# Patient Record
Sex: Female | Born: 1970 | Hispanic: No | Marital: Married | Smoking: Never smoker
Health system: Southern US, Community
[De-identification: ages and names within clinical notes are randomized; demographics above are authoritative.]

## PROBLEM LIST (undated history)

## (undated) DIAGNOSIS — K649 Unspecified hemorrhoids: Secondary | ICD-10-CM

## (undated) DIAGNOSIS — J45909 Unspecified asthma, uncomplicated: Secondary | ICD-10-CM

## (undated) DIAGNOSIS — D649 Anemia, unspecified: Secondary | ICD-10-CM

## (undated) HISTORY — DX: Unspecified hemorrhoids: K64.9

## (undated) HISTORY — DX: Anemia, unspecified: D64.9

---
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
## 2001-03-28 ENCOUNTER — Ambulatory Visit (HOSPITAL_COMMUNITY): Admission: RE | Admit: 2001-03-28 | Discharge: 2001-03-28 | Payer: Self-pay | Admitting: *Deleted

## 2001-03-28 ENCOUNTER — Encounter: Payer: Self-pay | Admitting: *Deleted

## 2001-08-21 ENCOUNTER — Encounter: Payer: Self-pay | Admitting: *Deleted

## 2001-08-21 ENCOUNTER — Ambulatory Visit (HOSPITAL_COMMUNITY): Admission: RE | Admit: 2001-08-21 | Discharge: 2001-08-21 | Payer: Self-pay | Admitting: *Deleted

## 2001-08-24 ENCOUNTER — Inpatient Hospital Stay (HOSPITAL_COMMUNITY): Admission: AD | Admit: 2001-08-24 | Discharge: 2001-08-27 | Payer: Self-pay | Admitting: Obstetrics

## 2007-10-02 ENCOUNTER — Inpatient Hospital Stay (HOSPITAL_COMMUNITY): Admission: AD | Admit: 2007-10-02 | Discharge: 2007-10-02 | Payer: Self-pay | Admitting: Obstetrics and Gynecology

## 2007-11-06 ENCOUNTER — Inpatient Hospital Stay (HOSPITAL_COMMUNITY): Admission: AD | Admit: 2007-11-06 | Discharge: 2007-11-09 | Payer: Self-pay | Admitting: Obstetrics

## 2008-11-16 ENCOUNTER — Emergency Department (HOSPITAL_COMMUNITY): Admission: EM | Admit: 2008-11-16 | Discharge: 2008-11-16 | Payer: Self-pay | Admitting: Emergency Medicine

## 2009-03-21 ENCOUNTER — Ambulatory Visit (HOSPITAL_COMMUNITY): Admission: RE | Admit: 2009-03-21 | Discharge: 2009-03-21 | Payer: Self-pay | Admitting: Obstetrics & Gynecology

## 2009-06-24 ENCOUNTER — Inpatient Hospital Stay (HOSPITAL_COMMUNITY): Admission: RE | Admit: 2009-06-24 | Discharge: 2009-06-24 | Payer: Self-pay | Admitting: Obstetrics & Gynecology

## 2009-08-26 ENCOUNTER — Inpatient Hospital Stay (HOSPITAL_COMMUNITY): Admission: RE | Admit: 2009-08-26 | Discharge: 2009-08-29 | Payer: Self-pay | Admitting: Obstetrics & Gynecology

## 2010-05-10 LAB — RH IMMUNE GLOB WKUP(>/=20WKS)(NOT WOMEN'S HOSP)

## 2010-05-10 LAB — CBC
HCT: 33.3 % — ABNORMAL LOW (ref 36.0–46.0)
Hemoglobin: 10.8 g/dL — ABNORMAL LOW (ref 12.0–15.0)
MCH: 20.3 pg — ABNORMAL LOW (ref 26.0–34.0)
MCH: 20.5 pg — ABNORMAL LOW (ref 26.0–34.0)
MCHC: 32 g/dL (ref 30.0–36.0)
MCHC: 32.3 g/dL (ref 30.0–36.0)
MCV: 63.3 fL — ABNORMAL LOW (ref 78.0–100.0)
Platelets: 225 10*3/uL (ref 150–400)
Platelets: 236 10*3/uL (ref 150–400)
RBC: 5.26 MIL/uL — ABNORMAL HIGH (ref 3.87–5.11)
RBC: 5.29 MIL/uL — ABNORMAL HIGH (ref 3.87–5.11)
RDW: 15.6 % — ABNORMAL HIGH (ref 11.5–15.5)
RDW: 15.9 % — ABNORMAL HIGH (ref 11.5–15.5)
WBC: 4.4 10*3/uL (ref 4.0–10.5)
WBC: 6.8 10*3/uL (ref 4.0–10.5)

## 2010-05-10 LAB — RPR: RPR Ser Ql: NONREACTIVE

## 2010-05-12 LAB — RH IMMUNE GLOBULIN WORKUP (NOT WOMEN'S HOSP): Antibody Screen: NEGATIVE

## 2010-08-14 ENCOUNTER — Encounter (INDEPENDENT_AMBULATORY_CARE_PROVIDER_SITE_OTHER): Payer: Self-pay | Admitting: General Surgery

## 2010-08-14 DIAGNOSIS — K649 Unspecified hemorrhoids: Secondary | ICD-10-CM | POA: Insufficient documentation

## 2010-08-20 ENCOUNTER — Ambulatory Visit (INDEPENDENT_AMBULATORY_CARE_PROVIDER_SITE_OTHER): Payer: Self-pay | Admitting: General Surgery

## 2010-09-03 ENCOUNTER — Encounter (INDEPENDENT_AMBULATORY_CARE_PROVIDER_SITE_OTHER): Payer: Self-pay | Admitting: General Surgery

## 2010-09-10 ENCOUNTER — Ambulatory Visit (INDEPENDENT_AMBULATORY_CARE_PROVIDER_SITE_OTHER): Payer: 59 | Admitting: General Surgery

## 2010-09-10 ENCOUNTER — Encounter (INDEPENDENT_AMBULATORY_CARE_PROVIDER_SITE_OTHER): Payer: Self-pay | Admitting: General Surgery

## 2010-09-10 VITALS — BP 122/80 | HR 84 | Temp 97.3°F | Ht 65.0 in | Wt 193.2 lb

## 2010-09-10 DIAGNOSIS — K648 Other hemorrhoids: Secondary | ICD-10-CM

## 2010-09-10 NOTE — Patient Instructions (Signed)
I believe that the source of your rectal bleeding is internal hemorrhoids. I injected a medicine into them today that should make the bleeding stopped. If the bleeding does not stop in one month, then you need to see a gastroenterologist for a total colonoscopy. Keep her bowel movements soft with stool softeners. Drink lots of water and fluid. Return to see me only if there are worsening problems.

## 2010-09-10 NOTE — Progress Notes (Signed)
Subjective:     Patient ID: Holly Newton, female   DOB: [birth year], 40 y.o.   MRN: [id number]  HPI This is a 40 year old African woman from the [place]. She has had 5 pregnancies and 5 deliveries, her last delivery was in July 2011. She has had some rectal bleeding a little bit of discomfort since June of 2012. She has never had any rectal surgery reading rectal procedures done. She has not had any prior rectal problems. She denies any external swelling.  She was referred to me by sterile Dani Gobble, P.A. at [facility] on [address].  Past Medical History  Diagnosis Date  . Bleeding hemorrhoid   . Anemia     Current outpatient prescriptions:hydrocortisone (ANUSOL-HC) 25 MG suppository, Place 25 mg rectally 2 (two) times daily.  , Disp: , Rfl:   No Known Allergies  History reviewed. No pertinent family history.  History  Substance Use Topics  . Smoking status: Never Smoker   . Smokeless tobacco: Not on file  . Alcohol Use: No      Review of Systems  Constitutional: Negative.   HENT: Negative.   Eyes: Negative.   Respiratory: Negative.   Cardiovascular: Negative.   Gastrointestinal: Positive for constipation, blood in stool, anal bleeding and rectal pain. Negative for nausea, vomiting, abdominal pain, diarrhea and abdominal distention.  Genitourinary: Negative.   Skin: Negative.   Neurological: Negative.   Hematological: Negative.   Psychiatric/Behavioral: Negative.        Objective:   Physical Exam  Constitutional: She appears well-nourished. No distress.  HENT:  Head: Atraumatic.  Mouth/Throat: No oropharyngeal exudate.  Eyes: Conjunctivae and EOM are normal. No scleral icterus.  Neck: Normal range of motion. Neck supple. No thyromegaly present.  Cardiovascular: Normal rate, regular rhythm and normal heart sounds.  Exam reveals no gallop.   No murmur heard. Pulmonary/Chest: Effort normal and breath sounds normal. No respiratory  distress. She has no wheezes. She has no rales. She exhibits no tenderness.  Abdominal: Soft. Bowel sounds are normal. She exhibits no distension and no mass. There is no tenderness. There is no rebound and no guarding.  Genitourinary:     Musculoskeletal: Normal range of motion. She exhibits no edema and no tenderness.  Lymphadenopathy:    She has no cervical adenopathy.  Neurological: She displays normal reflexes. No cranial nerve deficit. She exhibits normal muscle tone. Coordination normal.  Skin: Skin is dry. No rash noted. No erythema. No pallor.  Psychiatric: She has a normal mood and affect. Judgment and thought content normal.       Assessment:     Intermittent rectal bleeding secondary to internal hemorrhoids.  Multiparity.    Plan:     Internal hemorrhoids were injected left lateral and right anterior today.  She is advised in a high-fiber, low-fat diet with a daily stool softeners and hydration.  She was advised that if she has any further bleeding after 2 weeks, that the source of the bleeding may be due to another cause and that she would need to be referred to a gastroenterologist for a colonoscopy.  Return to see me p.r.n.

## 2010-11-23 LAB — CBC
HCT: 30.3 — ABNORMAL LOW
Platelets: 260
RDW: 15.5

## 2010-11-25 LAB — CBC
Hemoglobin: 10.4 — ABNORMAL LOW
RDW: 15.5
WBC: 5.3

## 2012-03-18 ENCOUNTER — Emergency Department (HOSPITAL_COMMUNITY): Payer: Self-pay

## 2012-03-18 ENCOUNTER — Encounter (HOSPITAL_COMMUNITY): Payer: Self-pay | Admitting: Emergency Medicine

## 2012-03-18 ENCOUNTER — Other Ambulatory Visit: Payer: Self-pay

## 2012-03-18 ENCOUNTER — Emergency Department (HOSPITAL_COMMUNITY)
Admission: EM | Admit: 2012-03-18 | Discharge: 2012-03-18 | Disposition: A | Discharge status: Home / Self Care | Payer: Self-pay | Attending: Emergency Medicine | Admitting: Emergency Medicine

## 2012-03-18 DIAGNOSIS — R42 Dizziness and giddiness: Secondary | ICD-10-CM | POA: Insufficient documentation

## 2012-03-18 DIAGNOSIS — R51 Headache: Secondary | ICD-10-CM | POA: Insufficient documentation

## 2012-03-18 DIAGNOSIS — T7492XA Unspecified child maltreatment, confirmed, initial encounter: Secondary | ICD-10-CM | POA: Insufficient documentation

## 2012-03-18 DIAGNOSIS — T7491XA Unspecified adult maltreatment, confirmed, initial encounter: Secondary | ICD-10-CM | POA: Insufficient documentation

## 2012-03-18 DIAGNOSIS — Z862 Personal history of diseases of the blood and blood-forming organs and certain disorders involving the immune mechanism: Secondary | ICD-10-CM | POA: Insufficient documentation

## 2012-03-18 DIAGNOSIS — S0990XA Unspecified injury of head, initial encounter: Secondary | ICD-10-CM | POA: Insufficient documentation

## 2012-03-18 DIAGNOSIS — Z8719 Personal history of other diseases of the digestive system: Secondary | ICD-10-CM | POA: Insufficient documentation

## 2012-03-18 DIAGNOSIS — IMO0002 Reserved for concepts with insufficient information to code with codable children: Secondary | ICD-10-CM | POA: Insufficient documentation

## 2012-03-18 LAB — CBC
HCT: 33 % — ABNORMAL LOW (ref 36.0–46.0)
Hemoglobin: 10.6 g/dL — ABNORMAL LOW (ref 12.0–15.0)
MCH: 19.3 pg — ABNORMAL LOW (ref 26.0–34.0)
MCHC: 32.1 g/dL (ref 30.0–36.0)
MCV: 60 fL — ABNORMAL LOW (ref 78.0–100.0)
Platelets: 280 10*3/uL (ref 150–400)
RBC: 5.5 MIL/uL — ABNORMAL HIGH (ref 3.87–5.11)
RDW: 15.3 % (ref 11.5–15.5)
WBC: 3.9 10*3/uL — ABNORMAL LOW (ref 4.0–10.5)

## 2012-03-18 LAB — BASIC METABOLIC PANEL
BUN: 10 mg/dL (ref 6–23)
CO2: 25 mEq/L (ref 19–32)
Calcium: 8.8 mg/dL (ref 8.4–10.5)
Chloride: 102 mEq/L (ref 96–112)
Creatinine, Ser: 0.49 mg/dL — ABNORMAL LOW (ref 0.50–1.10)
GFR calc Af Amer: >90 mL/min (ref >90)
GFR calc non Af Amer: >90 mL/min (ref >90)
Glucose, Bld: 77 mg/dL (ref 70–99)
Potassium: 4 mEq/L (ref 3.5–5.1)
Sodium: 135 mEq/L (ref 135–145)

## 2012-03-18 LAB — POCT PREGNANCY, URINE: Preg Test, Ur: NEGATIVE

## 2012-03-18 MED ORDER — SODIUM CHLORIDE 0.9 % IV BOLUS (SEPSIS)
1000.0000 mL | Freq: Once | INTRAVENOUS | Status: AC
  Administered 2012-03-18: 1000 mL via INTRAVENOUS
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
## 2012-03-18 NOTE — ED Notes (Signed)
Pt logged rolled by 3 members staff maintaining c-spine precautions.  BB removed c-collar remains in place.  Pt had a 3-46mm nodule right posterior scalp.  No bleed or discharge.  Painful to palpation. Only complaint at this time
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
## 2012-03-18 NOTE — ED Notes (Signed)
Patient transported to CT 
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
## 2012-03-18 NOTE — ED Notes (Signed)
Pt is walking around in the room and walked out to the nurses desk

## 2012-03-19 NOTE — ED Provider Notes (Signed)
Medical screening examination/treatment/procedure(s) were conducted as a shared visit with non-physician practitioner(s) and myself.  I personally evaluated the patient during the encounter.  Please see completed note.  Alexa Golebiewski, MD 03/19/12 0913 

## 2012-10-20 ENCOUNTER — Encounter (HOSPITAL_COMMUNITY): Payer: Self-pay | Admitting: *Deleted

## 2012-10-20 ENCOUNTER — Emergency Department (HOSPITAL_COMMUNITY)
Admission: EM | Admit: 2012-10-20 | Discharge: 2012-10-21 | Disposition: A | Discharge status: Home / Self Care | Payer: BC Managed Care – PPO | Attending: Emergency Medicine | Admitting: Emergency Medicine

## 2012-10-20 DIAGNOSIS — Y929 Unspecified place or not applicable: Secondary | ICD-10-CM | POA: Insufficient documentation

## 2012-10-20 DIAGNOSIS — Y9301 Activity, walking, marching and hiking: Secondary | ICD-10-CM | POA: Insufficient documentation

## 2012-10-20 DIAGNOSIS — S91209A Unspecified open wound of unspecified toe(s) with damage to nail, initial encounter: Secondary | ICD-10-CM

## 2012-10-20 DIAGNOSIS — W010XXA Fall on same level from slipping, tripping and stumbling without subsequent striking against object, initial encounter: Secondary | ICD-10-CM | POA: Insufficient documentation

## 2012-10-20 DIAGNOSIS — IMO0002 Reserved for concepts with insufficient information to code with codable children: Secondary | ICD-10-CM | POA: Insufficient documentation

## 2012-10-20 DIAGNOSIS — S91109A Unspecified open wound of unspecified toe(s) without damage to nail, initial encounter: Secondary | ICD-10-CM | POA: Insufficient documentation

## 2012-10-20 MED ORDER — BUPIVACAINE HCL (PF) 0.5 % IJ SOLN
50.0000 mL | Freq: Once | INTRAMUSCULAR | Status: AC
  Administered 2012-10-21: 50 mL

## 2012-10-20 MED ORDER — BUPIVACAINE HCL (PF) 0.5 % IJ SOLN
INTRAMUSCULAR | Status: DC
Start: 2012-10-20 — End: 2012-10-21
  Filled 2012-10-20: qty 30

## 2012-10-20 NOTE — ED Notes (Signed)
Per pt report: pt tripped on dress and fell and hit her left big toe.  Toe nail is off the toe and pt is able to move big toe.  No obvious deformity noted.  Pt denies LOC or any injuries anywhere else.

## 2012-10-20 NOTE — ED Provider Notes (Signed)
CSN: [id number]     Arrival date & time 10/20/12  2309 History   First MD Initiated Contact with Patient 10/20/12 2316     Chief Complaint  Patient presents with  . Toe Pain   (Consider location/radiation/quality/duration/timing/severity/associated sxs/prior Treatment) HPI Comments: Patient was walking in [place] when she hit her left great toe causing the nail to be partially avulsed.   Patient is a 42 y.o. female presenting with toe pain. The history is provided by the patient.  Toe Pain This is a new problem. The problem occurs constantly. Pertinent negatives include no chills, fever or joint swelling. The symptoms are aggravated by walking. She has tried nothing for the symptoms.    Past Medical History  Diagnosis Date  . Bleeding hemorrhoid   . Anemia    History reviewed. No pertinent past surgical history. No family history on file. History  Substance Use Topics  . Smoking status: Never Smoker   . Smokeless tobacco: Not on file  . Alcohol Use: No   OB History   Grav Para Term Preterm Abortions TAB SAB Ect Mult Living                 Review of Systems  Constitutional: Negative for fever and chills.  Musculoskeletal: Negative for joint swelling.  Skin: Positive for wound.  All other systems reviewed and are negative.    Allergies  Review of patient's allergies indicates no known allergies.  Home Medications  No current outpatient prescriptions on file. BP 121/70  Pulse 93  Temp(Src) 97.6 F (36.4 C) (Oral)  Resp 18  Ht 5\' 5"  (1.651 m)  Wt 183 lb (83.008 kg)  BMI 30.45 kg/m2  SpO2 100%  LMP 09/24/2012 Physical Exam  Nursing note and vitals reviewed. Constitutional: She appears well-developed and well-nourished.  HENT:  Head: Normocephalic.  Eyes: Pupils are equal, round, and reactive to light.  Neck: Normal range of motion.  Cardiovascular: Normal rate.   Pulmonary/Chest: Effort normal.  Musculoskeletal: She exhibits tenderness. She exhibits no  edema.       Feet:  Neurological: She is alert.  Skin: Skin is warm.    ED Course  NAIL REMOVAL Date/Time: 10/21/2012 12:32 AM Performed by: Arman Filter Authorized by: Arman Filter Consent: Verbal consent obtained. Risks and benefits: risks, benefits and alternatives were discussed Consent given by: patient Patient identity confirmed: verbally with patient Anesthesia: digital block Patient sedated: no Preparation: skin prepped with [employer] Amount removed: complete Nail bed sutured: no Nail matrix removed: complete Removed nail replaced and anchored: no Dressing: Xeroform gauze Patient tolerance: Patient tolerated the procedure well with no immediate complications. Comments: Nail removed no bleeding or laceration of nail bed    (including critical care time) Labs Review Labs Reviewed - No data to display Imaging Review No results found.  MDM   1. Nail avulsion of toe, initial encounter        Arman Filter, NP 10/21/12 [phone]

## 2012-10-21 NOTE — ED Provider Notes (Signed)
Medical screening examination/treatment/procedure(s) were performed by non-physician practitioner and as supervising physician I was immediately available for consultation/collaboration.  Avishai Reihl M Girl Schissler, MD 10/21/12 0618 

## 2013-06-28 ENCOUNTER — Encounter: Payer: Self-pay | Admitting: Obstetrics & Gynecology

## 2013-06-28 ENCOUNTER — Ambulatory Visit: Payer: BC Managed Care – PPO | Admitting: Obstetrics & Gynecology

## 2013-06-28 ENCOUNTER — Ambulatory Visit (INDEPENDENT_AMBULATORY_CARE_PROVIDER_SITE_OTHER): Payer: BC Managed Care – PPO | Admitting: Obstetrics & Gynecology

## 2013-06-28 VITALS — BP 112/76 | HR 67 | Temp 98.0°F | Ht 65.0 in | Wt 183.0 lb

## 2013-06-28 DIAGNOSIS — Z30431 Encounter for routine checking of intrauterine contraceptive device: Secondary | ICD-10-CM

## 2013-06-28 NOTE — Progress Notes (Signed)
Patient ID: Holly Newton, female   DOB: 03/10/1970, 43 y.o.   MRN: [id number]  Chief Complaint  Patient presents with  . IUD Check    HPI Holly Newton is a 43 y.o. female.  The IUD was placed several years ago.  The has never had the IUD checked after placement  HPI  Past Medical History  Diagnosis Date  . Bleeding hemorrhoid   . Anemia     History reviewed. No pertinent past surgical history.  History reviewed. No pertinent family history.  Social History History  Substance Use Topics  . Smoking status: Never Smoker   . Smokeless tobacco: Not on file  . Alcohol Use: No    No Known Allergies  No current outpatient prescriptions on file.   No current facility-administered medications for this visit.    Review of Systems Review of Systems Constitutional: negative for fatigue and weight loss Respiratory: negative for cough and wheezing Cardiovascular: negative for chest pain, fatigue and palpitations Gastrointestinal: negative for abdominal pain and change in bowel habits Genitourinary:positive for introitus "too big" Integument/breast: negative for nipple discharge Musculoskeletal:negative for myalgias Neurological: negative for gait problems and tremors Behavioral/Psych: negative for abusive relationship, depression Endocrine: negative for temperature intolerance     Blood pressure 112/76, pulse 67, temperature 98 F (36.7 C), height 5\' 5"  (1.651 m), weight 83.008 kg (183 lb), last menstrual period 06/25/2013.  Physical Exam Physical Exam General:   alert  Skin:   no rash or abnormalities  Lungs:   clear to auscultation bilaterally  Heart:   regular rate and rhythm, S1, S2 normal, no murmur, click, rub or gallop  Breasts:   normal without suspicious masses, skin or nipple changes or axillary nodes  Abdomen:  normal findings: no organomegaly, soft, non-tender and no hernia  Pelvis:  External genitalia: normal general appearance Urinary system: urethral  meatus normal and bladder without fullness, nontender Vaginal: normal without tenderness, induration or masses Cervix: normal appearance; IUD strings seen Adnexa: normal bimanual exam Uterus: anteverted and non-tender, normal size      Data Reviewed None  Assessment    IUD in place Pt has concerns re: possible gaping introitus    Plan    Possible candidate for a plastic procedure; recommend postponing any procedure until childbearing complete; risk of scarring, pain reviewed Follow up as needed.         Antionette CharLisa Jackson-Moore 07/01/2013, 1:25 PM

## 2013-07-26 ENCOUNTER — Telehealth: Payer: Self-pay | Admitting: *Deleted

## 2013-07-26 NOTE — Telephone Encounter (Signed)
Needs preop visit

## 2013-07-26 NOTE — Telephone Encounter (Signed)
Please make pre op appointment for patient with Dr Tamela Oddi, first available, non urgent.

## 2013-07-26 NOTE — Telephone Encounter (Signed)
Pt called in to office in regards to wanting surgery scheduled.  It was discussed at her last visit that she may have vaginal sugery / possible female circ revision. Pt would like to know when she would be able to have procedure done.    Please advise.

## 2013-07-31 NOTE — Telephone Encounter (Signed)
[id number] - [facility] - LEFT PATIENT VM TO CALL AND SCHEDULE APPT REQUESTED.

## 2013-08-04 ENCOUNTER — Encounter (HOSPITAL_COMMUNITY): Payer: Self-pay | Admitting: Emergency Medicine

## 2013-08-04 ENCOUNTER — Emergency Department (HOSPITAL_COMMUNITY)
Admission: EM | Admit: 2013-08-04 | Discharge: 2013-08-04 | Disposition: A | Discharge status: Home / Self Care | Payer: 59 | Attending: Emergency Medicine | Admitting: Emergency Medicine

## 2013-08-04 DIAGNOSIS — Z8679 Personal history of other diseases of the circulatory system: Secondary | ICD-10-CM | POA: Insufficient documentation

## 2013-08-04 DIAGNOSIS — H9209 Otalgia, unspecified ear: Secondary | ICD-10-CM | POA: Diagnosis not present

## 2013-08-04 DIAGNOSIS — J3489 Other specified disorders of nose and nasal sinuses: Secondary | ICD-10-CM | POA: Insufficient documentation

## 2013-08-04 DIAGNOSIS — H938X9 Other specified disorders of ear, unspecified ear: Secondary | ICD-10-CM | POA: Insufficient documentation

## 2013-08-04 DIAGNOSIS — H938X1 Other specified disorders of right ear: Secondary | ICD-10-CM

## 2013-08-04 DIAGNOSIS — Z862 Personal history of diseases of the blood and blood-forming organs and certain disorders involving the immune mechanism: Secondary | ICD-10-CM | POA: Insufficient documentation

## 2013-08-04 MED ORDER — ALBUTEROL SULFATE HFA 108 (90 BASE) MCG/ACT IN AERS
2.0000 | INHALATION_SPRAY | Freq: Once | RESPIRATORY_TRACT | Status: AC
  Administered 2013-08-04: 2 via RESPIRATORY_TRACT
  Filled 2013-08-04: qty 6.7

## 2013-08-04 MED ORDER — PSEUDOEPHEDRINE HCL 30 MG/5ML PO SYRP: 30.0000 mg | ORAL_SOLUTION | Freq: Four times a day (QID) | ORAL | Status: DC | PRN

## 2013-08-04 NOTE — ED Provider Notes (Signed)
CSN: [id number]     Arrival date & time 08/04/13  [id number] History   First MD Initiated Contact with Patient 08/04/13 1006     Chief Complaint  Patient presents with  . Ear Fullness     (Consider location/radiation/quality/duration/timing/severity/associated sxs/prior Treatment) Patient is a 43 y.o. female presenting with plugged ear sensation. The history is provided by the patient. No language interpreter was used.  Ear Fullness This is a new problem. The current episode started yesterday. The problem occurs constantly. The problem has been unchanged. Associated symptoms include congestion. Pertinent negatives include no abdominal pain, arthralgias, chest pain, chills, fatigue, fever, nausea, neck pain, vomiting or weakness. Nothing aggravates the symptoms. She has tried nothing for the symptoms. The treatment provided no relief.    Past Medical History  Diagnosis Date  . Bleeding hemorrhoid   . Anemia    History reviewed. No pertinent past surgical history. No family history on file. History  Substance Use Topics  . Smoking status: Never Smoker   . Smokeless tobacco: Not on file  . Alcohol Use: No   OB History   Grav Para Term Preterm Abortions TAB SAB Ect Mult Living   5 5 5  0 0 0 0 0 0 5     Review of Systems  Constitutional: Negative for fever, chills and fatigue.  HENT: Positive for congestion and ear pain. Negative for trouble swallowing.   Eyes: Negative for visual disturbance.  Respiratory: Negative for shortness of breath.   Cardiovascular: Negative for chest pain and palpitations.  Gastrointestinal: Negative for nausea, vomiting, abdominal pain and diarrhea.  Genitourinary: Negative for dysuria and difficulty urinating.  Musculoskeletal: Negative for arthralgias and neck pain.  Skin: Negative for color change.  Neurological: Negative for dizziness and weakness.  Psychiatric/Behavioral: Negative for dysphoric mood.      Allergies  Review of patient's allergies  indicates no known allergies.  Home Medications   Prior to Admission medications   Not on File   BP 117/57  Pulse 77  Temp(Src) 98.4 F (36.9 C) (Oral)  Resp 18  SpO2 100% Physical Exam  Nursing note and vitals reviewed. Constitutional: She is oriented to person, place, and time. She appears well-developed and well-nourished. No distress.  HENT:  Head: Normocephalic and atraumatic.  Right Ear: External ear normal.  Left Ear: External ear normal.  Mouth/Throat: Oropharynx is clear and moist. No oropharyngeal exudate.  Right TM mildly erythematous and bulging and intact. No tenderness with retraction of the right auricle. No mastoid tenderness.   Left TM intact and unremarkable. No tenderness with retraction of auricle. No mastoid tenderness.   Eyes: Conjunctivae and EOM are normal. Pupils are equal, round, and reactive to light.  Neck: Normal range of motion.  Cardiovascular: Normal rate and regular rhythm.  Exam reveals no gallop and no friction rub.   No murmur heard. Pulmonary/Chest: Effort normal and breath sounds normal. She has no wheezes. She has no rales. She exhibits no tenderness.  Musculoskeletal: Normal range of motion.  Lymphadenopathy:    She has no cervical adenopathy.  Neurological: She is alert and oriented to person, place, and time. Coordination normal.  Speech is goal-oriented. Moves limbs without ataxia.   Skin: Skin is warm and dry.  Psychiatric: She has a normal mood and affect. Her behavior is normal.    ED Course  Procedures (including critical care time) Labs Review Labs Reviewed - No data to display  Imaging Review No results found.   EKG Interpretation None  MDM   Final diagnoses:  Congestion of right ear   10:20 AM Patient likely has congestion causing her right ear fullness and pressure sensation. Vitals stable and patient afebrile. Patient will likely benefit from a decongestant medication. Patient also requesting an albuterol  inhaler.    Holly BeckKaitlyn Aimy Sweeting, PA-C 08/04/13 1025

## 2013-08-04 NOTE — ED Notes (Signed)
Pt c/o "water in my right ear" that started yesterday. Pt states that it is painful.

## 2013-08-04 NOTE — Discharge Instructions (Signed)
Take Sudafed as needed for ear congestion.

## 2013-08-05 NOTE — ED Provider Notes (Signed)
Medical screening examination/treatment/procedure(s) were performed by non-physician practitioner and as supervising physician I was immediately available for consultation/collaboration.   EKG Interpretation None       Kimla Furth, MD 08/05/13 1009 

## 2013-08-06 ENCOUNTER — Telehealth: Payer: Self-pay | Admitting: *Deleted

## 2013-08-06 NOTE — Telephone Encounter (Signed)
[id number] - 8: 52 am  - called patient and scheduled appt

## 2013-08-06 NOTE — Telephone Encounter (Signed)
Holly Newton is calling wanting to schedule her surgery for revision before she leaves the country. Holly Newton is wanting to have her surgery in July. Holly Newton informed that I will have to check [facility] availability and she may not be able to do that before she has to travel. Will p[ut her on the cancellation list.

## 2013-08-09 ENCOUNTER — Telehealth: Payer: Self-pay | Admitting: *Deleted

## 2013-08-09 NOTE — Telephone Encounter (Signed)
A return call was placed to pt to discuss her need for appt for a pre op visit.  Pt states that she would like to have surgery in July.  Made pt aware that she would first need a pre op visit in office to discuss with Dr Tamela OddiJackson Moore.  Pt states that if surgery can't be done in July that she will not be traveling out of town.  Made pt aware that we could try to move up her pre op appt.  Pt now scheduled for pre op on 09/05/13.

## 2013-09-05 ENCOUNTER — Ambulatory Visit: Payer: Self-pay | Admitting: Obstetrics & Gynecology

## 2013-10-29 ENCOUNTER — Encounter (HOSPITAL_COMMUNITY): Payer: Self-pay | Admitting: Emergency Medicine

## 2013-10-29 ENCOUNTER — Emergency Department (HOSPITAL_COMMUNITY)
Admission: EM | Admit: 2013-10-29 | Discharge: 2013-10-30 | Disposition: A | Discharge status: Home / Self Care | Payer: 59 | Attending: Emergency Medicine | Admitting: Emergency Medicine

## 2013-10-29 DIAGNOSIS — Z862 Personal history of diseases of the blood and blood-forming organs and certain disorders involving the immune mechanism: Secondary | ICD-10-CM | POA: Insufficient documentation

## 2013-10-29 DIAGNOSIS — F410 Panic disorder [episodic paroxysmal anxiety] without agoraphobia: Secondary | ICD-10-CM | POA: Diagnosis present

## 2013-10-29 DIAGNOSIS — R45851 Suicidal ideations: Secondary | ICD-10-CM | POA: Diagnosis not present

## 2013-10-29 DIAGNOSIS — Z8679 Personal history of other diseases of the circulatory system: Secondary | ICD-10-CM | POA: Diagnosis not present

## 2013-10-29 DIAGNOSIS — F4322 Adjustment disorder with anxiety: Secondary | ICD-10-CM

## 2013-10-29 NOTE — ED Notes (Addendum)
Bed: WU98 Expected date:  Expected time:  Means of arrival:  Comments: Bed 16, EMS, 63F, Anxiety

## 2013-10-29 NOTE — ED Notes (Signed)
Patient was at work at [employer] found in the laundry room having a panic attack and would not talk to anyone. Patient stated that her husband divorced her and took her kids. Her boyfriend now does not want to be with her anymore. She stated she wants to hurt herself.

## 2013-10-29 NOTE — ED Notes (Signed)
Pt concerned about leaving her keys in the break room at work. Requested to call her job and have them take the keys to the front desk. Staff called pt job and confirmed keys were at the front desk. Spoke with Joe.

## 2013-10-30 ENCOUNTER — Encounter (HOSPITAL_COMMUNITY): Payer: Self-pay | Admitting: Emergency Medicine

## 2013-10-30 LAB — SALICYLATE LEVEL: Salicylate Lvl: <2 mg/dL — ABNORMAL LOW (ref 2.8–20.0)

## 2013-10-30 LAB — CBC
HCT: 30.4 % — ABNORMAL LOW (ref 36.0–46.0)
Hemoglobin: 9.6 g/dL — ABNORMAL LOW (ref 12.0–15.0)
MCH: 18.8 pg — ABNORMAL LOW (ref 26.0–34.0)
MCHC: 31.6 g/dL (ref 30.0–36.0)
MCV: 59.5 fL — ABNORMAL LOW (ref 78.0–100.0)
Platelets: 280 10*3/uL (ref 150–400)
RBC: 5.11 MIL/uL (ref 3.87–5.11)
RDW: 15.8 % — ABNORMAL HIGH (ref 11.5–15.5)
WBC: 4.8 10*3/uL (ref 4.0–10.5)

## 2013-10-30 LAB — COMPREHENSIVE METABOLIC PANEL
ALT: 10 U/L (ref 0–35)
AST: 14 U/L (ref 0–37)
Albumin: 3.4 g/dL — ABNORMAL LOW (ref 3.5–5.2)
Alkaline Phosphatase: 91 U/L (ref 39–117)
Anion gap: 14 (ref 5–15)
BUN: 9 mg/dL (ref 6–23)
CO2: 22 mEq/L (ref 19–32)
Calcium: 9.2 mg/dL (ref 8.4–10.5)
Chloride: 103 mEq/L (ref 96–112)
Creatinine, Ser: 0.54 mg/dL (ref 0.50–1.10)
GFR calc Af Amer: >90 mL/min (ref >90)
GFR calc non Af Amer: >90 mL/min (ref >90)
Glucose, Bld: 100 mg/dL — ABNORMAL HIGH (ref 70–99)
Potassium: 3.3 mEq/L — ABNORMAL LOW (ref 3.7–5.3)
Sodium: 139 mEq/L (ref 137–147)
Total Bilirubin: <0.2 mg/dL — ABNORMAL LOW (ref 0.3–1.2)
Total Protein: 7.7 g/dL (ref 6.0–8.3)

## 2013-10-30 LAB — RAPID URINE DRUG SCREEN, HOSP PERFORMED
AMPHETAMINES: NOT DETECTED
BARBITURATES: NOT DETECTED
Benzodiazepines: NOT DETECTED
Cocaine: NOT DETECTED
Opiates: NOT DETECTED
Tetrahydrocannabinol: NOT DETECTED

## 2013-10-30 LAB — ACETAMINOPHEN LEVEL

## 2013-10-30 LAB — ETHANOL: Alcohol, Ethyl (B): <11 mg/dL (ref 0–11)

## 2013-10-30 MED ORDER — IBUPROFEN 200 MG PO TABS: 600.0000 mg | ORAL_TABLET | Freq: Three times a day (TID) | ORAL | Status: DC | PRN

## 2013-10-30 MED ORDER — ALUM & MAG HYDROXIDE-SIMETH 200-200-20 MG/5ML PO SUSP: 30.0000 mL | ORAL | Status: DC | PRN

## 2013-10-30 MED ORDER — ONDANSETRON HCL 4 MG PO TABS: 4.0000 mg | ORAL_TABLET | Freq: Three times a day (TID) | ORAL | Status: DC | PRN

## 2013-10-30 MED ORDER — POTASSIUM CHLORIDE CRYS ER 20 MEQ PO TBCR
40.0000 meq | EXTENDED_RELEASE_TABLET | Freq: Once | ORAL | Status: AC
  Administered 2013-10-30: 40 meq via ORAL
  Filled 2013-10-30: qty 2

## 2013-10-30 MED ORDER — ACETAMINOPHEN 325 MG PO TABS: 650.0000 mg | ORAL_TABLET | ORAL | Status: DC | PRN

## 2013-10-30 NOTE — [facility] Suicide Risk Assessment (Signed)
Suicide Risk Assessment  Admission Assessment     Nursing information obtained from:    Demographic factors:    Current Mental Status:    Loss Factors:    Historical Factors:    Risk Reduction Factors:    Total Time spent with patient: 45 minutes  CLINICAL FACTORS:   Panic Attacks  Psychiatric Specialty Exam:     Blood pressure 127/72, pulse 85, temperature 97.9 F (36.6 C), temperature source Oral, resp. rate 18, SpO2 100.00%.There is no weight on file to calculate BMI.  General Appearance: Well Groomed  Patent attorney::  Good  Speech:  Clear and Coherent  Volume:  Normal  Mood:  Anxious  Affect:  Appropriate  Thought Process:  Coherent  Orientation:  Full (Time, Place, and Person)  Thought Content:  Negative  Suicidal Thoughts:  No  Homicidal Thoughts:  No  Memory:  Immediate;   Good Recent;   Good Remote;   Good  Judgement:  Good  Insight:  Good  Psychomotor Activity:  Normal  Concentration:  Good  Recall:  Good  Fund of Knowledge:Good  Language: Good  Akathisia:  Negative  Handed:  Right  AIMS (if indicated):     Assets:  Communication Skills Desire for Improvement Financial Resources/Insurance Housing Leisure Time Physical Health Resilience Social Support Talents/Skills Transportation Vocational/Educational  Sleep:      Musculoskeletal: Strength & Muscle Tone: within normal limits Gait & Station: normal Patient leans: N/A  COGNITIVE FEATURES THAT CONTRIBUTE TO RISK:  none    SUICIDE RISK:   Minimal: No identifiable suicidal ideation.  Patients presenting with no risk factors but with morbid ruminations; may be classified as minimal risk based on the severity of the depressive symptoms  PLAN OF CARE:  I certify that inpatient services furnished can reasonably be expected to improve the patient's condition.  TAYLOR,GERALD D 10/30/2013, 10:15 AM

## 2013-10-30 NOTE — ED Notes (Signed)
D/C instructions given. Nothing specific written. Encouraged to return to ED immediately if suicidal thoughts returned-stated that she understood. Did sign "no harm contract." No prescriptions written. Denies SI, HI and AVH. Denies pain. No complaints voiced. Belongings bag x2 returned. Escorted by mental health tech to front of hospital.

## 2013-10-30 NOTE — Progress Notes (Signed)
  CARE MANAGEMENT ED NOTE 10/30/2013  Patient:  Holly Newton, Holly Newton   Account Number:  [id number]  Date Initiated:  10/30/2013  Documentation initiated by:  Edd Arbour  Subjective/Objective Assessment:   43 yr old [employer] brought in by [facility] after a panic attack at work pt husband divorced her and took the kids Boyfriend does not want her anymore States she wants to hurt herself From [place] Speaks Arabic and some English     Subjective/Objective Assessment Detail:   DX mood disorder PMH anemia, bleeding hemorrhoid No pcp as confirmed by pt  Pt with no chs admission but 2 ED visits in last 6 months last one prior to this visit for "water in ear"     Action/Plan:   ED Cm spoke with pt who states she has not seen pcps but only doctors for her children   Action/Plan Detail:   seenote below   Anticipated DC Date:  10/30/2013     Status Recommendation to Physician:   Result of Recommendation:    Other ED Services  Consult Working Plan    DC Planning Services  Other  PCP issues  Outpatient Services - Pt will follow up    Choice offered to / List presented to:            Status of service:  Completed, signed off  ED Comments:   ED Comments Detail:  [facility] ED CM spoke with pt on how to obtain an in network pcp with insurance coverage via the customer service number or web site Cm reviewed ED level of care for crisis/emergent services and community pcp level of care to manage continuous or chronic medical concerns.  The pt voiced understanding CM encouraged pt and discussed pt's responsibility to verify with pt's insurance carrier that any recommended medical provider offered by any emergency room or a hospital provider is within the carrier's network. The pt voiced understanding

## 2013-10-30 NOTE — Consult Note (Signed)
[facility] Face-to-Face Psychiatry Consult   Reason for Consult:  Anxiety attack Referring Physician:  ER MD  Holly Newton is an 43 y.o. female. Total Time spent with patient: 45 minutes  Assessment: AXIS I:  Adjustment Disorder with Anxiety AXIS II:  Deferred AXIS III:   Past Medical History  Diagnosis Date  . Bleeding hemorrhoid   . Anemia    AXIS IV:  other psychosocial or environmental problems AXIS V:  61-70 mild symptoms  Plan:  No evidence of imminent risk to self or others at present.    Subjective:   Holly Newton is a 43 y.o. female patient admitted with anxiety attack.  HPI:  Holly Newton said she had an anxiety attack, the EMS while she was at work.  Had talked to her family in [place] wanting to go home for a visit but they did not want her to lose her job while she was away.  That led to the panic attack.  She says she is not suicidal and wants to be discharged so she can go to work today.  She is separated from her husband but sees her children on a scheduled basis, she says. HPI Elements:   Location:  anxiety attack. Quality:  concerned fellow workers enough to call EMS. Severity:  not suicidal. Timing:  issues with visiting her family. Duration:  one day. Context:  as above.  Past Psychiatric History: Past Medical History  Diagnosis Date  . Bleeding hemorrhoid   . Anemia     reports that she has never smoked. She does not have any smokeless tobacco history on file. She reports that she does not drink alcohol or use illicit drugs. History reviewed. No pertinent family history. Family History Substance Abuse: No Family Supports: Yes, List: (Family ) Living Arrangements: Non-relatives/Friends (Lives with roommate ) Can pt return to current living arrangement?: Yes Abuse/Neglect [facility]) Physical Abuse: Denies Verbal Abuse: Denies Sexual Abuse: Denies Allergies:  No Known Allergies  ACT Assessment Complete:  Yes:    Educational Status    Risk to Self: Risk to  self with the past 6 months Suicidal Ideation: No Suicidal Intent: No Is patient at risk for suicide?: No Suicidal Plan?: No Access to Means: No What has been your use of drugs/alcohol within the last 12 months?: None  Previous Attempts/Gestures: No How many times?: 0 Other Self Harm Risks: None  Triggers for Past Attempts: None known Intentional Self Injurious Behavior: None Family Suicide History: No Recent stressful life event(s): Conflict (Comment) (Conversation with family 10/29/13) Persecutory voices/beliefs?: No Depression: Yes Depression Symptoms: Loss of interest in usual pleasures Substance abuse history and/or treatment for substance abuse?: No Suicide prevention information given to non-admitted patients: Not applicable  Risk to Others: Risk to Others within the past 6 months Homicidal Ideation: No Thoughts of Harm to Others: No Current Homicidal Intent: No Current Homicidal Plan: No Access to Homicidal Means: No Identified Victim: None  History of harm to others?: No Assessment of Violence: None Noted Violent Behavior Description: None  Does patient have access to weapons?: No Criminal Charges Pending?: No Does patient have a court date: No  Abuse: Abuse/Neglect Assessment (Assessment to be complete while patient is alone) Physical Abuse: Denies Verbal Abuse: Denies Sexual Abuse: Denies Exploitation of patient/patient's resources: Denies Self-Neglect: Denies  Prior Inpatient Therapy: Prior Inpatient Therapy Prior Inpatient Therapy: No Prior Therapy Dates: None  Prior Therapy Facilty/Provider(s): None  Reason for Treatment: None   Prior Outpatient Therapy: Prior Outpatient Therapy  Prior Outpatient Therapy: No Prior Therapy Dates: None  Prior Therapy Facilty/Provider(s): None  Reason for Treatment: None   Additional Information: Additional Information 1:1 In Past 12 Months?: No CIRT Risk: No Elopement Risk: No Does patient have medical clearance?: Yes                   Objective: Blood pressure 127/72, pulse 85, temperature 97.9 F (36.6 C), temperature source Oral, resp. rate 18, SpO2 100.00%.There is no weight on file to calculate BMI. Results for orders placed during the hospital encounter of 10/29/13 (from the past 72 hour(s))  URINE RAPID DRUG SCREEN (HOSP PERFORMED)     Status: None   Collection Time    10/29/13 11:24 PM      Result Value Ref Range   Opiates NONE DETECTED  NONE DETECTED   Cocaine NONE DETECTED  NONE DETECTED   Benzodiazepines NONE DETECTED  NONE DETECTED   Amphetamines NONE DETECTED  NONE DETECTED   Tetrahydrocannabinol NONE DETECTED  NONE DETECTED   Barbiturates NONE DETECTED  NONE DETECTED   Comment:            DRUG SCREEN FOR MEDICAL PURPOSES     ONLY.  IF CONFIRMATION IS NEEDED     FOR ANY PURPOSE, NOTIFY LAB     WITHIN 5 DAYS.                LOWEST DETECTABLE LIMITS     FOR URINE DRUG SCREEN     Drug Class       Cutoff (ng/mL)     Amphetamine      1000     Barbiturate      200     Benzodiazepine   102     Tricyclics       585     Opiates          300     Cocaine          300     THC              50  CBC     Status: Abnormal   Collection Time    10/29/13 11:38 PM      Result Value Ref Range   WBC 4.8  4.0 - 10.5 K/uL   RBC 5.11  3.87 - 5.11 MIL/uL   Hemoglobin 9.6 (*) 12.0 - 15.0 g/dL   HCT 30.4 (*) 36.0 - 46.0 %   MCV 59.5 (*) 78.0 - 100.0 fL   MCH 18.8 (*) 26.0 - 34.0 pg   MCHC 31.6  30.0 - 36.0 g/dL   RDW 15.8 (*) 11.5 - 15.5 %   Platelets 280  150 - 400 K/uL   Comment: REPEATED TO VERIFY  COMPREHENSIVE METABOLIC PANEL     Status: Abnormal   Collection Time    10/29/13 11:38 PM      Result Value Ref Range   Sodium 139  137 - 147 mEq/L   Potassium 3.3 (*) 3.7 - 5.3 mEq/L   Chloride 103  96 - 112 mEq/L   CO2 22  19 - 32 mEq/L   Glucose, Bld 100 (*) 70 - 99 mg/dL   BUN 9  6 - 23 mg/dL   Creatinine, Ser 0.54  0.50 - 1.10 mg/dL   Calcium 9.2  8.4 - 10.5 mg/dL   Total  Protein 7.7  6.0 - 8.3 g/dL   Albumin 3.4 (*) 3.5 - 5.2 g/dL   AST 14  0 - 37 U/L   ALT 10  0 - 35 U/L   Alkaline Phosphatase 91  39 - 117 U/L   Total Bilirubin <0.2 (*) 0.3 - 1.2 mg/dL   GFR calc non Af Amer >90  >90 mL/min   GFR calc Af Amer >90  >90 mL/min   Comment: (NOTE)     The eGFR has been calculated using the CKD EPI equation.     This calculation has not been validated in all clinical situations.     eGFR's persistently <90 mL/min signify possible Chronic Kidney     Disease.   Anion gap 14  5 - 15  ETHANOL     Status: None   Collection Time    10/29/13 11:38 PM      Result Value Ref Range   Alcohol, Ethyl (B) <11  0 - 11 mg/dL   Comment:            LOWEST DETECTABLE LIMIT FOR     SERUM ALCOHOL IS 11 mg/dL     FOR MEDICAL PURPOSES ONLY  ACETAMINOPHEN LEVEL     Status: None   Collection Time    10/29/13 11:38 PM      Result Value Ref Range   Acetaminophen (Tylenol), Serum <15.0  10 - 30 ug/mL   Comment:            THERAPEUTIC CONCENTRATIONS VARY     SIGNIFICANTLY. A RANGE OF 10-30     ug/mL MAY BE AN EFFECTIVE     CONCENTRATION FOR MANY PATIENTS.     HOWEVER, SOME ARE BEST TREATED     AT CONCENTRATIONS OUTSIDE THIS     RANGE.     ACETAMINOPHEN CONCENTRATIONS     >150 ug/mL AT 4 HOURS AFTER     INGESTION AND >50 ug/mL AT 12     HOURS AFTER INGESTION ARE     OFTEN ASSOCIATED WITH TOXIC     REACTIONS.  SALICYLATE LEVEL     Status: Abnormal   Collection Time    10/29/13 11:38 PM      Result Value Ref Range   Salicylate Lvl <1.6 (*) 2.8 - 20.0 mg/dL   Labs are reviewed and are pertinent for no psychiatric issues.  Current Facility-Administered Medications  Medication Dose Route Frequency Provider Last Rate Last Dose  . acetaminophen (TYLENOL) tablet 650 mg  650 mg Oral Q4H PRN April K Palumbo-Rasch, MD      . alum & mag hydroxide-simeth (MAALOX/MYLANTA) 200-200-20 MG/5ML suspension 30 mL  30 mL Oral PRN April K Palumbo-Rasch, MD      . ibuprofen  (ADVIL,MOTRIN) tablet 600 mg  600 mg Oral Q8H PRN April K Palumbo-Rasch, MD      . ondansetron [facility]) tablet 4 mg  4 mg Oral Q8H PRN April Alfonso Patten, MD       No current outpatient prescriptions on file.    Psychiatric Specialty Exam:     Blood pressure 127/72, pulse 85, temperature 97.9 F (36.6 C), temperature source Oral, resp. rate 18, SpO2 100.00%.There is no weight on file to calculate BMI.  General Appearance: Well Groomed  Engineer, water::  Good  Speech:  Clear and Coherent  Volume:  Normal  Mood:  Anxious  Affect:  Appropriate  Thought Process:  Coherent and Logical  Orientation:  Full (Time, Place, and Person)  Thought Content:  Negative  Suicidal Thoughts:  No  Homicidal Thoughts:  No  Memory:  Immediate;   Good Recent;  Good Remote;   Good  Judgement:  Good  Insight:  Good  Psychomotor Activity:  Normal  Concentration:  Good  Recall:  Good  Fund of Knowledge:Good  Language: Good  Akathisia:  Negative  Handed:  Right  AIMS (if indicated):     Assets:  Communication Skills Desire for Improvement Financial Resources/Insurance Housing Leisure Time [place] Talents/Skills Transportation Vocational/Educational  Sleep:      Musculoskeletal: Strength & Muscle Tone: within normal limits Gait & Station: normal Patient leans: N/A  Treatment Plan Summary: discharge home to be followed oyutpatient as she chooses  Franshesca Chipman D 10/30/2013 10:04 AM

## 2013-10-30 NOTE — BH Assessment (Signed)
Tele Assessment Note   Holly Newton is a 43 y.o. female who voluntarily presents to [facility] for psych eval.  Pt states that she was speaking with her family today(she is from [place]) in regards to going home to visit them.  Pt says her family told her not to come home because she has a family and job here.  Pt says their response upset and she had a panic attack and her employer called EMS services and brought her to the emerg dept.  Pt denies SI/HI/AVH, per nurses note, pt stated that she wanted to harm herself and she expressed that she her husband divorced her and took her children and her boyfriend broke off the relationship.    Pt says she's been divorced from her spouse x32yr but she visits with her children on the weekend.  Pt contracted for safety with this Clinical research associate and asked to go home because he she has to work tomorrow(10/30/13) at 2pm and needs to sleep.  Pt has no past hx of mental health issues and no services.  Pt.'s native language is Arabic but is able to communicate, pt may need to an interpreter to help with understanding some questions posed to her.     Axis I: Mood Disorder NOS Axis II: Deferred Axis III:  Past Medical History  Diagnosis Date  . Bleeding hemorrhoid   . Anemia    Axis IV: other psychosocial or environmental problems, problems related to social environment and problems with primary support group Axis V: 51-60 moderate symptoms  Past Medical History:  Past Medical History  Diagnosis Date  . Bleeding hemorrhoid   . Anemia     History reviewed. No pertinent past surgical history.  Family History: History reviewed. No pertinent family history.  Social History:  reports that she has never smoked. She does not have any smokeless tobacco history on file. She reports that she does not drink alcohol or use illicit drugs.  Additional Social History:  Alcohol / Drug Use Pain Medications: None  Prescriptions: None  Over the Counter: None  History of alcohol /  drug use?: No history of alcohol / drug abuse Longest period of sobriety (when/how long): None   CIWA: CIWA-Ar BP: 137/68 mmHg Pulse Rate: 82 COWS:    PATIENT STRENGTHS: (choose at least two) Supportive family/friends  Allergies: No Known Allergies  Home Medications:  (Not in a hospital admission)  OB/GYN Status:  No LMP recorded.  General Assessment Data Location of Assessment: [facility] Is this a Tele or Face-to-Face Assessment?: Face-to-Face Is this an Initial Assessment or a Re-assessment for this encounter?: Initial Assessment Living Arrangements: Non-relatives/Friends (Lives with roommate ) Can pt return to current living arrangement?: Yes Admission Status: Voluntary Is patient capable of signing voluntary admission?: Yes Transfer from: Acute Hospital Referral Source: MD  Medical Screening Exam [facility] Walk-in ONLY) Medical Exam completed: No Reason for MSE not completed: Other: (None )  [facility] Crisis Care Plan Living Arrangements: Non-relatives/Friends (Lives with roommate ) Name of Psychiatrist: None  Name of Therapist: None   Education Status Is patient currently in school?: No Current Grade: None  Highest grade of school patient has completed: None  Name of school: None  Contact person: None   Risk to self with the past 6 months Suicidal Ideation: No Suicidal Intent: No Is patient at risk for suicide?: No Suicidal Plan?: No Access to Means: No What has been your use of drugs/alcohol within the last 12 months?: None  Previous Attempts/Gestures: No  How many times?: 0 Other Self Harm Risks: None  Triggers for Past Attempts: None known Intentional Self Injurious Behavior: None Family Suicide History: No Recent stressful life event(s): Conflict (Comment) (Conversation with family 10/29/13) Persecutory voices/beliefs?: No Depression: Yes Depression Symptoms: Loss of interest in usual pleasures Substance abuse history and/or treatment for substance abuse?:  No Suicide prevention information given to non-admitted patients: Not applicable  Risk to Others within the past 6 months Homicidal Ideation: No Thoughts of Harm to Others: No Current Homicidal Intent: No Current Homicidal Plan: No Access to Homicidal Means: No Identified Victim: None  History of harm to others?: No Assessment of Violence: None Noted Violent Behavior Description: None  Does patient have access to weapons?: No Criminal Charges Pending?: No Does patient have a court date: No  Psychosis Hallucinations: None noted Delusions: None noted  Mental Status Report Appear/Hygiene: In scrubs Eye Contact: Good Motor Activity: Unremarkable Speech: Logical/coherent Level of Consciousness: Alert Mood: Sad Affect: Sad Anxiety Level: None Thought Processes: Coherent;Relevant Judgement: Unimpaired Orientation: Person;Place;Time;Situation Obsessive Compulsive Thoughts/Behaviors: None  Cognitive Functioning Concentration: Normal Memory: Recent Intact;Remote Intact IQ: Average Insight: Good Impulse Control: Good Appetite: Good Weight Loss: 0 Weight Gain: 0 Sleep: No Change Total Hours of Sleep: 6 Vegetative Symptoms: None  ADLScreening [facility] Assessment Services) Patient's cognitive ability adequate to safely complete daily activities?: Yes Patient able to express need for assistance with ADLs?: Yes Independently performs ADLs?: Yes (appropriate for developmental age)  Prior Inpatient Therapy Prior Inpatient Therapy: No Prior Therapy Dates: None  Prior Therapy Facilty/Provider(s): None  Reason for Treatment: None   Prior Outpatient Therapy Prior Outpatient Therapy: No Prior Therapy Dates: None  Prior Therapy Facilty/Provider(s): None  Reason for Treatment: None   ADL Screening (condition at time of admission) Patient's cognitive ability adequate to safely complete daily activities?: Yes Is the patient deaf or have difficulty hearing?: No Does the patient  have difficulty seeing, even when wearing glasses/contacts?: No Does the patient have difficulty concentrating, remembering, or making decisions?: No Patient able to express need for assistance with ADLs?: Yes Does the patient have difficulty dressing or bathing?: No Independently performs ADLs?: Yes (appropriate for developmental age) Does the patient have difficulty walking or climbing stairs?: No Weakness of Legs: None Weakness of Arms/Hands: None  Home Assistive Devices/Equipment Home Assistive Devices/Equipment: None  Therapy Consults (therapy consults require a physician order) PT Evaluation Needed: No OT Evalulation Needed: No SLP Evaluation Needed: No Abuse/Neglect Assessment (Assessment to be complete while patient is alone) Physical Abuse: Denies Verbal Abuse: Denies Sexual Abuse: Denies Exploitation of patient/patient's resources: Denies Self-Neglect: Denies Values / Beliefs Cultural Requests During Hospitalization: None Spiritual Requests During Hospitalization: None Consults Spiritual Care Consult Needed: No Social Work Consult Needed: No Merchant navy officer (For Healthcare) Does patient have an advance directive?: No Nutrition Screen- [facility] Patient's home diet: Regular  Additional Information 1:1 In Past 12 Months?: No CIRT Risk: No Elopement Risk: No Does patient have medical clearance?: Yes     Disposition:  Disposition Initial Assessment Completed for this Encounter: Yes Disposition of Patient: Referred to (AM psych eval for final disposition ) Patient referred to: Other (Comment) (AM psych eval for final disposition)  Murrell Redden 10/30/2013 4:45 AM

## 2013-10-30 NOTE — ED Notes (Signed)
Pt denies SI/HI/AVH.  Pt asked why she had to come to this unit instead of being discharged.  I explained that she must speak to someone in psychiatry first in order to ensure her safety.  Pt stated understanding. Pt states her native language is Arabic, but she stated she understands [place] well.

## 2013-10-30 NOTE — ED Provider Notes (Signed)
CSN: [id number]     Arrival date & time 10/29/13  2306 History   First MD Initiated Contact with Patient 10/29/13 2350     Chief Complaint  Patient presents with  . Suicidal  . Panic Attack     (Consider location/radiation/quality/duration/timing/severity/associated sxs/prior Treatment) Patient is a 43 y.o. female presenting with mental health disorder. The history is provided by the patient.  Mental Health Problem Presenting symptoms: no self mutilation and no suicide attempt   Degree of incapacity (severity):  Moderate Onset quality:  Sudden Timing:  Constant Progression:  Unchanged Context: not noncompliant   Treatment compliance:  Untreated Relieved by:  Nothing Worsened by:  Nothing tried Ineffective treatments:  None tried Associated symptoms: no abdominal pain   Risk factors: no neurological disease     Past Medical History  Diagnosis Date  . Bleeding hemorrhoid   . Anemia    History reviewed. No pertinent past surgical history. History reviewed. No pertinent family history. History  Substance Use Topics  . Smoking status: Never Smoker   . Smokeless tobacco: Not on file  . Alcohol Use: No   OB History   Grav Para Term Preterm Abortions TAB SAB Ect Mult Living   0 0 0 0 0 0 5     Review of Systems  Gastrointestinal: Negative for abdominal pain.  Psychiatric/Behavioral: Negative for self-injury.  All other systems reviewed and are negative.     Allergies  Review of patient's allergies indicates no known allergies.  Home Medications   Prior to Admission medications   Not on File   BP 137/68  Pulse 82  Temp(Src) 97.6 F (36.4 C) (Oral)  Resp 26  SpO2 100% Physical Exam  Constitutional: She is oriented to person, place, and time. She appears well-developed and well-nourished. No distress.  HENT:  Head: Normocephalic and atraumatic.  Mouth/Throat: Oropharynx is clear and moist.  Eyes: Conjunctivae are normal. Pupils are equal, round, and  reactive to light.  Neck: Normal range of motion. Neck supple.  Cardiovascular: Normal rate, regular rhythm and intact distal pulses.   Pulmonary/Chest: Effort normal and breath sounds normal. She has no wheezes. She has no rales.  Abdominal: Soft. Bowel sounds are normal. There is no tenderness. There is no rebound and no guarding.  Musculoskeletal: Normal range of motion.  Neurological: She is alert and oriented to person, place, and time.  Skin: Skin is warm and dry.    ED Course  Procedures (including critical care time) Labs Review Labs Reviewed  CBC - Abnormal; Notable for the following:    Hemoglobin 9.6 (*)    HCT 30.4 (*)    MCV 59.5 (*)    MCH 18.8 (*)    RDW 15.8 (*)    All other components within normal limits  URINE RAPID DRUG SCREEN (HOSP PERFORMED)  COMPREHENSIVE METABOLIC PANEL  ETHANOL  ACETAMINOPHEN LEVEL  SALICYLATE LEVEL    Imaging Review No results found.   EKG Interpretation None      MDM   Final diagnoses:  None    Told nurse she was suicidal she now denies will need to be assessed by ACT    Didi Ganaway K Odalys Win-Rasch, MD 10/30/13 [phone]

## 2013-10-31 ENCOUNTER — Institutional Professional Consult (permissible substitution): Payer: BC Managed Care – PPO | Admitting: Obstetrics & Gynecology

## 2013-10-31 ENCOUNTER — Encounter (HOSPITAL_BASED_OUTPATIENT_CLINIC_OR_DEPARTMENT_OTHER): Payer: Self-pay | Admitting: Emergency Medicine

## 2013-12-24 ENCOUNTER — Encounter (HOSPITAL_COMMUNITY): Payer: Self-pay | Admitting: Emergency Medicine

## 2014-02-18 ENCOUNTER — Encounter: Payer: Self-pay | Admitting: *Deleted

## 2014-02-19 ENCOUNTER — Encounter: Payer: Self-pay | Admitting: Obstetrics & Gynecology

## 2017-02-07 ENCOUNTER — Emergency Department (HOSPITAL_COMMUNITY): Payer: BLUE CROSS/BLUE SHIELD

## 2017-02-07 ENCOUNTER — Emergency Department (HOSPITAL_COMMUNITY)
Admission: EM | Admit: 2017-02-07 | Discharge: 2017-02-07 | Disposition: A | Discharge status: Home / Self Care | Payer: BLUE CROSS/BLUE SHIELD | Attending: Emergency Medicine | Admitting: Emergency Medicine

## 2017-02-07 ENCOUNTER — Other Ambulatory Visit: Payer: Self-pay

## 2017-02-07 ENCOUNTER — Encounter (HOSPITAL_COMMUNITY): Payer: Self-pay | Admitting: Emergency Medicine

## 2017-02-07 DIAGNOSIS — M25571 Pain in right ankle and joints of right foot: Secondary | ICD-10-CM | POA: Insufficient documentation

## 2017-02-07 NOTE — Progress Notes (Signed)
Orthopedic Tech Progress Note Patient Details:  Holly Newton 02/08/1971 [id number]  Ortho Devices Type of Ortho Device: ASO Ortho Device/Splint Location: LLE Ortho Device/Splint Interventions: Ordered, Application   Post Interventions Patient Tolerated: Well Instructions Provided: Care of device   Jennye MoccasinHughes, Eyoel Throgmorton Craig 02/07/2017, 8:35 PM

## 2017-02-07 NOTE — ED Notes (Signed)
Pt departed in NAD.  

## 2017-02-07 NOTE — ED Triage Notes (Signed)
Pt hurt her L ankle slipping on ice on Thursday and has continued swelling and pain to the L ankle. CMS intact and pt is ambulatory. Pt has not come in earlier due to work schedule.

## 2017-02-07 NOTE — ED Notes (Signed)
Pt states her ankle is "okay now". Denies pain and is able to ambulate without difficulty. States "just wanted to check it out."

## 2017-02-07 NOTE — ED Provider Notes (Signed)
[facility] Note   CSN: [id number] Arrival date & time: 02/07/17  1645     History   Chief Complaint Chief Complaint  Patient presents with  . Ankle Pain    HPI Holly Newton is a 46 y.o. female presenting with left ankle pain.  Patient states she slipped on ice on Thursday.  She had acute onset of right ankle pain, which lasted for several days.  She currently denies any pain, and has full active range of motion of the ankle without pain.  She reports continued swelling and bruising of the ankle.  She is here today, as she just wants it evaluated, and this was the first day she was off work.  She denies numbness or tingling.  She denies injury of the ankle before.  She denies pain elsewhere.  She denies hitting her head or loss of consciousness when she slipped.  She is not on blood thinners.  She has not been taking anything for pain including Tylenol or ibuprofen.  HPI  Past Medical History:  Diagnosis Date  . Anemia   . Bleeding hemorrhoid     Patient Active Problem List   Diagnosis Date Noted  . Hemorrhoids 08/14/2010    History reviewed. No pertinent surgical history.  OB History    Gravida Para Term Preterm AB Living   5 5 5  0 0 5   SAB TAB Ectopic Multiple Live Births   0 0 0 0 5       Home Medications    Prior to Admission medications   Not on File    Family History No family history on file.  Social History Social History   Tobacco Use  . Smoking status: Never Smoker  . Smokeless tobacco: Never Used  Substance Use Topics  . Alcohol use: No  . Drug use: No     Allergies   Patient has no known allergies.   Review of Systems Review of Systems  Musculoskeletal: Positive for arthralgias and joint swelling.  Neurological: Negative for numbness.  Hematological: Does not bruise/bleed easily.     Physical Exam Updated Vital Signs BP (!) 121/108 (BP Location: Right Arm)   Pulse 83    Temp 97.9 F (36.6 C) (Oral)   Resp 18   LMP 02/04/2017   SpO2 100%   Physical Exam  Constitutional: She is oriented to person, place, and time. She appears well-developed and well-nourished. No distress.  HENT:  Head: Normocephalic and atraumatic.  Eyes: EOM are normal.  Neck: Normal range of motion.  Pulmonary/Chest: Effort normal.  Abdominal: She exhibits no distension.  Musculoskeletal: She exhibits tenderness. She exhibits no deformity.  Mild swelling on medial malleolus with surrounding contusion.  Pedal pulses intact bilaterally.  Sensation intact bilaterally.  Full active range of motion of the ankle without pain.  Minimal tenderness palpation of the medial and lateral ankle.  No tenderness palpation of the lower leg.  Achilles intact.  Patient is ambulatory.  Neurological: She is alert and oriented to person, place, and time. No sensory deficit.  Skin: Skin is warm. No rash noted.  Psychiatric: She has a normal mood and affect.  Nursing note and vitals reviewed.    ED Treatments / Results  Labs (all labs ordered are listed, but only abnormal results are displayed) Labs Reviewed - No data to display  EKG  EKG Interpretation None       Radiology Dg Ankle Complete Left  Result Date: 02/07/2017  CLINICAL DATA:  Pt c/o posterior left ankle and left foot pain after slipping in on ice last Thursday. No hx of prior injuries or surgeries to either area. EXAM: LEFT ANKLE COMPLETE - 3+ VIEW COMPARISON:  None. FINDINGS: Osseous alignment is normal. Ankle mortise is symmetric. No fracture line or displaced fracture fragment seen P visualized portions of the hindfoot and midfoot appear intact and normally aligned. Adjacent soft tissues are unremarkable. IMPRESSION: Negative. Electronically Signed   By: Bary RichardStan  Maynard M.D.   On: 02/07/2017 18:37   Dg Foot Complete Left  Result Date: 02/07/2017 CLINICAL DATA:  Left foot pain after slipping on ice last Thursday. EXAM: LEFT FOOT -  COMPLETE 3+ VIEW COMPARISON:  None. FINDINGS: There is no evidence of fracture or dislocation. There is no evidence of arthropathy or other focal bone abnormality. Small Achilles enthesophyte. Soft tissues are unremarkable. IMPRESSION: Negative. Electronically Signed   By: Obie DredgeWilliam T Derry M.D.   On: 02/07/2017 18:38    Procedures Procedures (including critical care time)  Medications Ordered in ED Medications - No data to display   Initial Impression / Assessment and Plan / ED Course  I have reviewed the triage vital signs and the nursing notes.  Pertinent labs & imaging results that were available during my care of the patient were reviewed by me and considered in my medical decision making (see chart for details).     Presenting for evaluation of ankle injury.  She had acute onset pain several days ago, which has since resolved.  Physical exam shows she is neurovascularly intact.  X-rays negative for fracture dislocation.  As she has continued swelling and tenderness, will give ASO brace and instructed on NSAID use. RICE therapy instructed.  At this time, patient appears safe for discharge.  Return precautions given.  Patient states she understands and agrees to plan.   Final Clinical Impressions(s) / ED Diagnoses   Final diagnoses:  Acute right ankle pain    ED Discharge Orders    None       Alveria ApleyCaccavale, Maddelyn Rocca, PA-C 02/08/17 0118    Rolan BuccoBelfi, Melanie, MD 02/08/17 1358

## 2017-02-07 NOTE — Discharge Instructions (Signed)
Take ibuprofen 3 times a day with meals.  Do not take other anti-inflammatories at the same time open (Advil, Motrin, naproxen, Aleve). You may supplement with Tylenol if you need further pain control. Use ice packs to help control your pain. Follow up with your primary care doctor in 1 week if your pain is not improving.  Return to the ER if you develop numbness, color change, worsening pain, or any new or concerning symptoms.

## 2017-04-26 ENCOUNTER — Encounter: Payer: Self-pay | Admitting: Emergency Medicine

## 2017-04-26 ENCOUNTER — Ambulatory Visit: Payer: BLUE CROSS/BLUE SHIELD | Admitting: Emergency Medicine

## 2017-04-26 VITALS — BP 122/72 | HR 73 | Temp 98.2°F | Resp 17 | Ht 64.5 in | Wt 187.0 lb

## 2017-04-26 DIAGNOSIS — Z862 Personal history of diseases of the blood and blood-forming organs and certain disorders involving the immune mechanism: Secondary | ICD-10-CM | POA: Diagnosis not present

## 2017-04-26 DIAGNOSIS — R42 Dizziness and giddiness: Secondary | ICD-10-CM

## 2017-04-26 NOTE — Progress Notes (Signed)
Holly Newton 47 y.o.   Chief Complaint  Patient presents with  . Anemia    per patient     HISTORY OF PRESENT ILLNESS: This is a 47 y.o. female complaining of intermittent chronic dizziness. Has history of anemia and wants her blood checked.  No other significant past medical history or significant symptoms. HPI   Prior to Admission medications   Not on File    No Known Allergies  Patient Active Problem List   Diagnosis Date Noted  . Hemorrhoids 08/14/2010    Past Medical History:  Diagnosis Date  . Anemia   . Bleeding hemorrhoid     No past surgical history on file.  Social History   Socioeconomic History  . Marital status: Married    Spouse name: Not on file  . Number of children: Not on file  . Years of education: Not on file  . Highest education level: Not on file  Social Needs  . Financial resource strain: Not on file  . Food insecurity - worry: Not on file  . Food insecurity - inability: Not on file  . Transportation needs - medical: Not on file  . Transportation needs - non-medical: Not on file  Occupational History  . Not on file  Tobacco Use  . Smoking status: Never Smoker  . Smokeless tobacco: Never Used  Substance and Sexual Activity  . Alcohol use: No  . Drug use: No  . Sexual activity: Not Currently    Birth control/protection: IUD  Other Topics Concern  . Not on file  Social History Narrative  . Not on file    No family history on file.   Review of Systems  Constitutional: Negative.  Negative for chills, fever and weight loss.  HENT: Negative.  Negative for congestion, nosebleeds and sore throat.   Eyes: Negative.  Negative for discharge and redness.  Respiratory: Negative.  Negative for cough, shortness of breath and wheezing.   Cardiovascular: Negative.  Negative for chest pain, palpitations and leg swelling.  Gastrointestinal: Negative.  Negative for abdominal pain, blood in stool, diarrhea, melena, nausea and vomiting.    Genitourinary: Negative.  Negative for dysuria and hematuria.  Musculoskeletal: Negative.   Skin: Negative.   Neurological: Positive for dizziness (Chronic). Negative for sensory change, focal weakness, loss of consciousness and headaches.  Endo/Heme/Allergies: Negative.   All other systems reviewed and are negative.  Vitals:   04/26/17 1556  BP: 122/72  Pulse: 73  Resp: 17  Temp: 98.2 F (36.8 C)  SpO2: 98%     Physical Exam  Constitutional: She is oriented to person, place, and time. She appears well-developed and well-nourished.  HENT:  Head: Normocephalic and atraumatic.  Right Ear: External ear normal.  Left Ear: External ear normal.  Nose: Nose normal.  Mouth/Throat: Oropharynx is clear and moist.  Eyes: Conjunctivae and EOM are normal. Pupils are equal, round, and reactive to light.  Neck: Normal range of motion. Neck supple. No JVD present. No thyromegaly present.  Cardiovascular: Normal rate, regular rhythm and normal heart sounds.  Pulmonary/Chest: Effort normal and breath sounds normal.  Abdominal: Soft. Bowel sounds are normal. She exhibits no distension. There is no tenderness.  Musculoskeletal: Normal range of motion. She exhibits no edema or tenderness.  Lymphadenopathy:    She has no cervical adenopathy.  Neurological: She is alert and oriented to person, place, and time. No sensory deficit. She exhibits normal muscle tone.  Skin: Skin is warm and dry. Capillary refill takes  less than 2 seconds.  Psychiatric: She has a normal mood and affect. Her behavior is normal.  Vitals reviewed.    ASSESSMENT & PLAN: Milla was seen today for anemia.  Diagnoses and all orders for this visit:  Dizziness -     CBC with Differential/Platelet -     Comprehensive metabolic panel  History of anemia -     CBC with Differential/Platelet -     Comprehensive metabolic panel   A total of 30 minutes was spent in the room with the patient, greater than 50% of which was  in counseling/coordination of care.  Patient Instructions       IF you received an x-ray today, you will receive an invoice from [facility]. Please contact [facility] at [phone] with questions or concerns regarding your invoice.   IF you received labwork today, you will receive an invoice from [facility]. Please contact [facility] at [phone] with questions or concerns regarding your invoice.   Our billing staff will not be able to assist you with questions regarding bills from these companies.  You will be contacted with the lab results as soon as they are available. The fastest way to get your results is to activate your My Chart account. Instructions are located on the last page of this paperwork. If you have not heard from [place] regarding the results in 2 weeks, please contact this office.      Anemia Anemia is a condition in which you do not have enough red blood cells or hemoglobin. Hemoglobin is a substance in red blood cells that carries oxygen. When you do not have enough red blood cells or hemoglobin (are anemic), your body cannot get enough oxygen and your organs may not work properly. As a result, you may feel very tired or have other problems. What are the causes? Common causes of anemia include:  Excessive bleeding. Anemia can be caused by excessive bleeding inside or outside the body, including bleeding from the intestine or from periods in women.  Poor nutrition.  Long-lasting (chronic) kidney, thyroid, and liver disease.  Bone marrow disorders.  Cancer and treatments for cancer.  HIV (human immunodeficiency virus) and AIDS (acquired immunodeficiency syndrome).  Treatments for HIV and AIDS.  Spleen problems.  Blood disorders.  Infections, medicines, and autoimmune disorders that destroy red blood cells.  What are the signs or symptoms? Symptoms of this condition include:  Minor weakness.  Dizziness.  Headache.  Feeling  heartbeats that are irregular or faster than normal (palpitations).  Shortness of breath, especially with exercise.  Paleness.  Cold sensitivity.  Indigestion.  Nausea.  Difficulty sleeping.  Difficulty concentrating.  Symptoms may occur suddenly or develop slowly. If your anemia is mild, you may not have symptoms. How is this diagnosed? This condition is diagnosed based on:  Blood tests.  Your medical history.  A physical exam.  Bone marrow biopsy.  Your health care provider may also check your stool (feces) for blood and may do additional testing to look for the cause of your bleeding. You may also have other tests, including:  Imaging tests, such as a CT scan or MRI.  Endoscopy.  Colonoscopy.  How is this treated? Treatment for this condition depends on the cause. If you continue to lose a lot of blood, you may need to be treated at a hospital. Treatment may include:  Taking supplements of iron, vitamin O75, or folic acid.  Taking a hormone medicine (erythropoietin) that can help to stimulate red blood  cell growth.  Having a blood transfusion. This may be needed if you lose a lot of blood.  Making changes to your diet.  Having surgery to remove your spleen.  Follow these instructions at home:  Take over-the-counter and prescription medicines only as told by your health care provider.  Take supplements only as told by your health care provider.  Follow any diet instructions that you were given.  Keep all follow-up visits as told by your health care provider. This is important. Contact a health care provider if:  You develop new bleeding anywhere in the body. Get help right away if:  You are very weak.  You are short of breath.  You have pain in your abdomen or chest.  You are dizzy or feel faint.  You have trouble concentrating.  You have bloody or black, tarry stools.  You vomit repeatedly or you vomit up blood. Summary  Anemia is a  condition in which you do not have enough red blood cells or enough of a substance in your red blood cells that carries oxygen (hemoglobin).  Symptoms may occur suddenly or develop slowly.  If your anemia is mild, you may not have symptoms.  This condition is diagnosed with blood tests as well as a medical history and physical exam. Other tests may be needed.  Treatment for this condition depends on the cause of the anemia. This information is not intended to replace advice given to you by your health care provider. Make sure you discuss any questions you have with your health care provider. Document Released: 03/18/2004 Document Revised: 03/12/2016 Document Reviewed: 03/12/2016 Elsevier Interactive Patient Education  2018 Elsevier Inc.      Agustina Caroli, MD [facility]

## 2017-04-26 NOTE — Patient Instructions (Addendum)
   IF you received an x-ray today, you will receive an invoice from [facility]. Please contact [facility] at [phone] with questions or concerns regarding your invoice.   IF you received labwork today, you will receive an invoice from [facility]. Please contact [facility] at [phone] with questions or concerns regarding your invoice.   Our billing staff will not be able to assist you with questions regarding bills from these companies.  You will be contacted with the lab results as soon as they are available. The fastest way to get your results is to activate your My Chart account. Instructions are located on the last page of this paperwork. If you have not heard from us regarding the results in 2 weeks, please contact this office.     Anemia Anemia is a condition in which you do not have enough red blood cells or hemoglobin. Hemoglobin is a substance in red blood cells that carries oxygen. When you do not have enough red blood cells or hemoglobin (are anemic), your body cannot get enough oxygen and your organs may not work properly. As a result, you may feel very tired or have other problems. What are the causes? Common causes of anemia include:  Excessive bleeding. Anemia can be caused by excessive bleeding inside or outside the body, including bleeding from the intestine or from periods in women.  Poor nutrition.  Long-lasting (chronic) kidney, thyroid, and liver disease.  Bone marrow disorders.  Cancer and treatments for cancer.  HIV (human immunodeficiency virus) and AIDS (acquired immunodeficiency syndrome).  Treatments for HIV and AIDS.  Spleen problems.  Blood disorders.  Infections, medicines, and autoimmune disorders that destroy red blood cells.  What are the signs or symptoms? Symptoms of this condition include:  Minor weakness.  Dizziness.  Headache.  Feeling heartbeats that are irregular or faster than normal  (palpitations).  Shortness of breath, especially with exercise.  Paleness.  Cold sensitivity.  Indigestion.  Nausea.  Difficulty sleeping.  Difficulty concentrating.  Symptoms may occur suddenly or develop slowly. If your anemia is mild, you may not have symptoms. How is this diagnosed? This condition is diagnosed based on:  Blood tests.  Your medical history.  A physical exam.  Bone marrow biopsy.  Your health care provider may also check your stool (feces) for blood and may do additional testing to look for the cause of your bleeding. You may also have other tests, including:  Imaging tests, such as a CT scan or MRI.  Endoscopy.  Colonoscopy.  How is this treated? Treatment for this condition depends on the cause. If you continue to lose a lot of blood, you may need to be treated at a hospital. Treatment may include:  Taking supplements of iron, vitamin B12, or folic acid.  Taking a hormone medicine (erythropoietin) that can help to stimulate red blood cell growth.  Having a blood transfusion. This may be needed if you lose a lot of blood.  Making changes to your diet.  Having surgery to remove your spleen.  Follow these instructions at home:  Take over-the-counter and prescription medicines only as told by your health care provider.  Take supplements only as told by your health care provider.  Follow any diet instructions that you were given.  Keep all follow-up visits as told by your health care provider. This is important. Contact a health care provider if:  You develop new bleeding anywhere in the body. Get help right away if:  You are very weak.    You are short of breath.  You have pain in your abdomen or chest.  You are dizzy or feel faint.  You have trouble concentrating.  You have bloody or black, tarry stools.  You vomit repeatedly or you vomit up blood. Summary  Anemia is a condition in which you do not have enough red blood  cells or enough of a substance in your red blood cells that carries oxygen (hemoglobin).  Symptoms may occur suddenly or develop slowly.  If your anemia is mild, you may not have symptoms.  This condition is diagnosed with blood tests as well as a medical history and physical exam. Other tests may be needed.  Treatment for this condition depends on the cause of the anemia. This information is not intended to replace advice given to you by your health care provider. Make sure you discuss any questions you have with your health care provider. Document Released: 03/18/2004 Document Revised: 03/12/2016 Document Reviewed: 03/12/2016 Elsevier Interactive Patient Education  2018 Elsevier Inc.  

## 2017-04-27 LAB — CBC WITH DIFFERENTIAL/PLATELET
BASOS: 1 %
Basophils Absolute: 0 10*3/uL (ref 0.0–0.2)
EOS (ABSOLUTE): 0.1 10*3/uL (ref 0.0–0.4)
Eos: 2 %
Hematocrit: 31.5 % — ABNORMAL LOW (ref 34.0–46.6)
Hemoglobin: 9.5 g/dL — ABNORMAL LOW (ref 11.1–15.9)
IMMATURE GRANULOCYTES: 0 %
Immature Grans (Abs): 0 10*3/uL (ref 0.0–0.1)
Lymphocytes Absolute: 1.9 10*3/uL (ref 0.7–3.1)
Lymphs: 47 %
MCH: 18.4 pg — ABNORMAL LOW (ref 26.6–33.0)
MCHC: 30.2 g/dL — ABNORMAL LOW (ref 31.5–35.7)
MCV: 61 fL — AB (ref 79–97)
MONOS ABS: 0.4 10*3/uL (ref 0.1–0.9)
Monocytes: 10 %
NEUTROS PCT: 40 %
Neutrophils Absolute: 1.6 10*3/uL (ref 1.4–7.0)
Platelets: 308 10*3/uL (ref 150–379)
RBC: 5.16 x10E6/uL (ref 3.77–5.28)
RDW: 15.5 % — AB (ref 12.3–15.4)
WBC: 3.9 10*3/uL (ref 3.4–10.8)

## 2017-04-27 LAB — COMPREHENSIVE METABOLIC PANEL
A/G RATIO: 1.1 — AB (ref 1.2–2.2)
ALK PHOS: 85 IU/L (ref 39–117)
ALT: 10 IU/L (ref 0–32)
AST: 13 IU/L (ref 0–40)
Albumin: 4 g/dL (ref 3.5–5.5)
BUN/Creatinine Ratio: 22 (ref 9–23)
BUN: 12 mg/dL (ref 6–24)
Bilirubin Total: <0.2 mg/dL (ref 0.0–1.2)
CALCIUM: 9.2 mg/dL (ref 8.7–10.2)
CO2: 24 mmol/L (ref 20–29)
Chloride: 100 mmol/L (ref 96–106)
Creatinine, Ser: 0.54 mg/dL — ABNORMAL LOW (ref 0.57–1.00)
GFR calc Af Amer: 131 mL/min/{1.73_m2} (ref >59)
GFR, EST NON AFRICAN AMERICAN: 114 mL/min/{1.73_m2} (ref >59)
Globulin, Total: 3.5 g/dL (ref 1.5–4.5)
Glucose: 89 mg/dL (ref 65–99)
POTASSIUM: 4.3 mmol/L (ref 3.5–5.2)
Sodium: 136 mmol/L (ref 134–144)
Total Protein: 7.5 g/dL (ref 6.0–8.5)

## 2017-05-24 ENCOUNTER — Telehealth: Payer: Self-pay | Admitting: Emergency Medicine

## 2017-05-24 NOTE — Telephone Encounter (Signed)
Pt brought in FMLA forms to be completed by Dr Alvy BimlerSagardia, according to the note that the patient provided with the forms she is requesting Medical leave from her job for every Fri, Sat and Sun due to the fact that she has her kids on the weekends and doesn't have a babysitter. This is not a medical reason to have FMLA forms completed but I wanted to check with Dr Alvy BimlerSagardia and make sure he had not seen her for an inlless that would require her to miss work.  Please let me know, I will be more than happy to call the pt and explain that this is not a medical reason for FMLA and that if she does have a medical reason she will need to come in for an OV.  I will keep the forms at my desk until I hear from the provider.

## 2017-05-24 NOTE — Telephone Encounter (Signed)
Agree with you. No medical reason.Thanks.

## 2017-05-30 NOTE — Telephone Encounter (Signed)
Will call the pt today to let her know about the FMLA forms not getting completed.

## 2017-06-06 ENCOUNTER — Emergency Department (HOSPITAL_COMMUNITY)
Admission: EM | Admit: 2017-06-06 | Discharge: 2017-06-06 | Disposition: A | Discharge status: Home / Self Care | Payer: BLUE CROSS/BLUE SHIELD | Attending: Emergency Medicine | Admitting: Emergency Medicine

## 2017-06-06 ENCOUNTER — Other Ambulatory Visit: Payer: Self-pay

## 2017-06-06 ENCOUNTER — Encounter (HOSPITAL_COMMUNITY): Payer: Self-pay | Admitting: Emergency Medicine

## 2017-06-06 DIAGNOSIS — X58XXXD Exposure to other specified factors, subsequent encounter: Secondary | ICD-10-CM | POA: Diagnosis not present

## 2017-06-06 DIAGNOSIS — S61301D Unspecified open wound of left index finger with damage to nail, subsequent encounter: Secondary | ICD-10-CM | POA: Diagnosis present

## 2017-06-06 DIAGNOSIS — S61309S Unspecified open wound of unspecified finger with damage to nail, sequela: Secondary | ICD-10-CM

## 2017-06-06 DIAGNOSIS — S61102D Unspecified open wound of left thumb with damage to nail, subsequent encounter: Secondary | ICD-10-CM | POA: Diagnosis not present

## 2017-06-06 NOTE — Discharge Instructions (Signed)
Your nails should continue to grow back. Keep them clean and dry.

## 2017-06-06 NOTE — ED Triage Notes (Signed)
Pt c/o fingernail partially removed and another fingernail barely hanging on from a machine at work.

## 2017-06-06 NOTE — ED Provider Notes (Signed)
[facility] Note   CSN: [id number] Arrival date & time: 06/06/17  1357     History   Chief Complaint Chief Complaint  Patient presents with  . Finger Injury    HPI Holly Newton is a 47 y.o. female presenting to the ED requesting a note to return to work. She states 3 months ago, she injured her left thumb nail and index finger nail, and it caused them to pull off. She states she has not had any problem with healing. Reports she works with [employer] and needs a note saying she is safe to return to work. No new complaints.  The history is provided by the patient.    Past Medical History:  Diagnosis Date  . Anemia   . Bleeding hemorrhoid     Patient Active Problem List   Diagnosis Date Noted  . History of anemia 04/26/2017  . Dizziness 04/26/2017  . Hemorrhoids 08/14/2010    History reviewed. No pertinent surgical history.   OB History    Gravida  5   Para  5   Term  5   Preterm  0   AB  0   Living  5     SAB  0   TAB  0   Ectopic  0   Multiple  0   Live Births  5            Home Medications    Prior to Admission medications   Not on File    Family History No family history on file.  Social History Social History   Tobacco Use  . Smoking status: Never Smoker  . Smokeless tobacco: Never Used  Substance Use Topics  . Alcohol use: No  . Drug use: No     Allergies   Patient has no known allergies.   Review of Systems Review of Systems  Musculoskeletal:       Old finger nail injury  Skin: Negative for wound.     Physical Exam Updated Vital Signs BP 124/77 (BP Location: Right Arm)   Pulse 70   Temp (!) 97.5 F (36.4 C) (Oral)   Resp 16   SpO2 100%   Physical Exam  Constitutional: She appears well-developed and well-nourished.  HENT:  Head: Normocephalic and atraumatic.  Eyes: Conjunctivae are normal.  Cardiovascular: Intact distal pulses.  Pulmonary/Chest: Effort  normal.  Skin:  Left thumb and index finger nail with old injury. Both nails have regrown about 50%, with healthy appearing skin surrounding. Thumb with old nail that is minimally attached at medial border (with new nail underneath)  Psychiatric: She has a normal mood and affect. Her behavior is normal.  Nursing note and vitals reviewed.    ED Treatments / Results  Labs (all labs ordered are listed, but only abnormal results are displayed) Labs Reviewed - No data to display  EKG None  Radiology No results found.  Procedures Procedures (including critical care time)  Medications Ordered in ED Medications - No data to display   Initial Impression / Assessment and Plan / ED Course  I have reviewed the triage vital signs and the nursing notes.  Pertinent labs & imaging results that were available during my care of the patient were reviewed by me and considered in my medical decision making (see chart for details).     Pt reporting to ED for note to return to work, after old finger nail injuries. Exam with new nails growing  to both thumb and index fingers. Thumb with minimal attachment of old nail to medial border, which was easily removed at bedside. Normal appearing skin surrounding new nails. Work note provided to return to work. Safe for discharge.  Discussed results, findings, treatment and follow up. Patient advised of return precautions. Patient verbalized understanding and agreed with plan.  Final Clinical Impressions(s) / ED Diagnoses   Final diagnoses:  Nail avulsion, finger, sequela    ED Discharge Orders    None       Robinson, [place] N, PA-C 06/06/17 1721    Margarita Grizzle, MD 06/07/17 1446

## 2017-06-29 ENCOUNTER — Other Ambulatory Visit: Payer: Self-pay

## 2017-06-29 ENCOUNTER — Ambulatory Visit (INDEPENDENT_AMBULATORY_CARE_PROVIDER_SITE_OTHER): Payer: BLUE CROSS/BLUE SHIELD | Admitting: Physician Assistant

## 2017-06-29 ENCOUNTER — Encounter: Payer: Self-pay | Admitting: Physician Assistant

## 2017-06-29 VITALS — BP 115/78 | HR 65 | Temp 97.9°F | Resp 18

## 2017-06-29 DIAGNOSIS — H8111 Benign paroxysmal vertigo, right ear: Secondary | ICD-10-CM

## 2017-06-29 DIAGNOSIS — R3915 Urgency of urination: Secondary | ICD-10-CM | POA: Diagnosis not present

## 2017-06-29 DIAGNOSIS — D649 Anemia, unspecified: Secondary | ICD-10-CM | POA: Diagnosis not present

## 2017-06-29 DIAGNOSIS — R82998 Other abnormal findings in urine: Secondary | ICD-10-CM | POA: Diagnosis not present

## 2017-06-29 DIAGNOSIS — R42 Dizziness and giddiness: Secondary | ICD-10-CM | POA: Diagnosis not present

## 2017-06-29 DIAGNOSIS — R110 Nausea: Secondary | ICD-10-CM

## 2017-06-29 LAB — POCT CBC
Granulocyte percent: 45.7 %G (ref 37–80)
HEMATOCRIT: 32.4 % — AB (ref 37.7–47.9)
Hemoglobin: 9.9 g/dL — AB (ref 12.2–16.2)
LYMPH, POC: 1.6 (ref 0.6–3.4)
MCH, POC: 18.3 pg — AB (ref 27–31.2)
MCHC: 30.5 g/dL — AB (ref 31.8–35.4)
MCV: 59.8 fL — AB (ref 80–97)
MID (cbc): 0.2 (ref 0–0.9)
MPV: 7.7 fL (ref 0–99.8)
POC GRANULOCYTE: 1.5 — AB (ref 2–6.9)
POC LYMPH %: 48.2 % (ref 10–50)
POC MID %: 6.1 % (ref 0–12)
Platelet Count, POC: 270 10*3/uL (ref 142–424)
RBC: 5.42 M/uL (ref 4.04–5.48)
RDW, POC: 15.4 %
WBC: 3.3 10*3/uL — AB (ref 4.6–10.2)

## 2017-06-29 LAB — POCT URINALYSIS DIP (MANUAL ENTRY)
BILIRUBIN UA: NEGATIVE mg/dL
Bilirubin, UA: NEGATIVE
Glucose, UA: NEGATIVE mg/dL
NITRITE UA: NEGATIVE
Protein Ur, POC: 30 mg/dL — AB
Spec Grav, UA: 1.025 (ref 1.010–1.025)
UROBILINOGEN UA: 0.2 U/dL
pH, UA: 7 (ref 5.0–8.0)

## 2017-06-29 LAB — POC MICROSCOPIC URINALYSIS (UMFC): Mucus: ABSENT

## 2017-06-29 LAB — POCT URINE PREGNANCY: PREG TEST UR: NEGATIVE

## 2017-06-29 LAB — GLUCOSE, POCT (MANUAL RESULT ENTRY): POC Glucose: 99 mg/dl (ref 70–99)

## 2017-06-29 MED ORDER — CEPHALEXIN 500 MG PO CAPS: 500.0000 mg | ORAL_CAPSULE | Freq: Two times a day (BID) | ORAL | 0 refills | Status: AC

## 2017-06-29 MED ORDER — ONDANSETRON 8 MG PO TBDP: 8.0000 mg | ORAL_TABLET | Freq: Three times a day (TID) | ORAL | 0 refills | Status: DC | PRN

## 2017-06-29 MED ORDER — MECLIZINE HCL 25 MG PO TABS
25.0000 mg | ORAL_TABLET | Freq: Three times a day (TID) | ORAL | 0 refills | Status: DC | PRN
Start: 2017-06-29 — End: 2018-02-17

## 2017-06-29 NOTE — Progress Notes (Signed)
[name]  MRN: [id number] DOB: 1970/05/23  Subjective:  Holly Newton is a 47 y.o. female seen in office today for a chief complaint of generalized abdominal discomfort x 1 day. Has assoicated nausea, decreased appetite, dizziness, urinary urgency, and suprapubic pressure. Denies fever, chills, vomiting, diarrhea, constipation, urinary frequency, melena, hematochezia, hematrua, and flank pain. Denies odd food exposure or recent travel. Has drank some juice. Not drank any water today. Last normal BM was today. She is currently on her menstrual cycles. Has PMH of vertigo and anemia, baseline hgb since 2015 is in the 9s. She is supposed to take iron but does not because it upsets her stomach. Last hgb was 9.5 two months ago. No PSH.   Review of Systems  Constitutional: Negative for diaphoresis.  HENT: Negative for congestion.   Eyes: Negative for photophobia and visual disturbance.  Respiratory: Negative for cough and shortness of breath.   Cardiovascular: Negative for chest pain.  Gastrointestinal: Negative for abdominal distention.  Neurological: Negative for facial asymmetry, speech difficulty, weakness, numbness and headaches.    Patient Active Problem List   Diagnosis Date Noted  . History of anemia 04/26/2017  . Dizziness 04/26/2017  . Hemorrhoids 08/14/2010    No current outpatient medications on file prior to visit.   No current facility-administered medications on file prior to visit.     No Known Allergies    Social History   Socioeconomic History  . Marital status: Married    Spouse name: Not on file  . Number of children: Not on file  . Years of education: Not on file  . Highest education level: Not on file  Occupational History  . Not on file  Social Needs  . Financial resource strain: Not on file  . Food insecurity:    Worry: Not on file    Inability: Not on file  . Transportation needs:    Medical: Not on file    Non-medical: Not on file  Tobacco  Use  . Smoking status: Never Smoker  . Smokeless tobacco: Never Used  Substance and Sexual Activity  . Alcohol use: No  . Drug use: No  . Sexual activity: Yes  Lifestyle  . Physical activity:    Days per week: Not on file    Minutes per session: Not on file  . Stress: Not on file  Relationships  . Social connections:    Talks on phone: Not on file    Gets together: Not on file    Attends religious service: Not on file    Active member of club or organization: Not on file    Attends meetings of clubs or organizations: Not on file    Relationship status: Not on file  . Intimate partner violence:    Fear of current or ex partner: Not on file    Emotionally abused: Not on file    Physically abused: Not on file    Forced sexual activity: Not on file  Other Topics Concern  . Not on file  Social History Narrative  . Not on file    Objective:  BP 115/78 (BP Location: Right Arm, Patient Position: Lying right side, Cuff Size: Normal)   Pulse 65   Temp 97.9 F (36.6 C) (Oral)   Resp 18   LMP 06/28/2017 (Exact Date)   SpO2 100%   Physical Exam  Constitutional: She is oriented to person, place, and time. She appears well-developed and well-nourished.  Appears uncomfortable lying on exam  table when I enter room; however when starting to discuss FMLA paperwork, she is well appearing, in NAD.    HENT:  Head: Normocephalic and atraumatic.  Eyes: Pupils are equal, round, and reactive to light. Conjunctivae and EOM are normal.  Neck: Full passive range of motion without pain.  Pulmonary/Chest: Effort normal.  Abdominal: Soft. Normal appearance and bowel sounds are normal. There is no tenderness. There is no rigidity, no rebound, no guarding, no CVA tenderness, no tenderness at McBurney's point and negative Murphy's sign.  Lymphadenopathy:       Head (right side): No submental, no submandibular, no tonsillar, no preauricular, no posterior auricular and no occipital adenopathy present.         Head (left side): No submental, no submandibular, no tonsillar, no preauricular, no posterior auricular and no occipital adenopathy present.    She has no cervical adenopathy.       Right: No supraclavicular adenopathy present.       Left: No supraclavicular adenopathy present.  Neurological: She is alert and oriented to person, place, and time. She has normal strength and normal reflexes. No cranial nerve deficit or sensory deficit. Gait normal.  Positive Dix Hall pike to the right  Skin: Skin is warm and dry.  Psychiatric: Her mood appears anxious.  Vitals reviewed.  Orthostatic VS for the past 24 hrs:  BP- Lying Pulse- Lying BP- Sitting Pulse- Sitting BP- Standing at 0 minutes Pulse- Standing at 0 minutes  06/29/17 1016 120/79 74 131/79 71 128/83 81      Results for orders placed or performed in visit on 06/29/17 (from the past 24 hour(s))  POCT CBC     Status: Abnormal   Collection Time: 06/29/17 10:34 AM  Result Value Ref Range   WBC 3.3 (A) 4.6 - 10.2 K/uL   Lymph, poc 1.6 0.6 - 3.4   POC LYMPH PERCENT 48.2 10 - 50 %L   MID (cbc) 0.2 0 - 0.9   POC MID % 6.1 0 - 12 %M   POC Granulocyte 1.5 (A) 2 - 6.9   Granulocyte percent 45.7 37 - 80 %G   RBC 5.42 4.04 - 5.48 M/uL   Hemoglobin 9.9 (A) 12.2 - 16.2 g/dL   HCT, POC 32.4 (A) 37.7 - 47.9 %   MCV 59.8 (A) 80 - 97 fL   MCH, POC 18.3 (A) 27 - 31.2 pg   MCHC 30.5 (A) 31.8 - 35.4 g/dL   RDW, POC 15.4 %   Platelet Count, POC 270 142 - 424 K/uL   MPV 7.7 0 - 99.8 fL  POCT glucose (manual entry)     Status: None   Collection Time: 06/29/17 10:35 AM  Result Value Ref Range   POC Glucose 99 70 - 99 mg/dl  POCT urinalysis dipstick     Status: Abnormal   Collection Time: 06/29/17 11:30 AM  Result Value Ref Range   Color, UA orange (A) yellow   Clarity, UA cloudy (A) clear   Glucose, UA negative negative mg/dL   Bilirubin, UA negative negative   Ketones, POC UA negative negative mg/dL   Spec Grav, UA 1.025 1.010 - 1.025    Blood, UA large (A) negative   pH, UA 7.0 5.0 - 8.0   Protein Ur, POC =30 (A) negative mg/dL   Urobilinogen, UA 0.2 0.2 or 1.0 E.U./dL   Nitrite, UA Negative Negative   Leukocytes, UA Trace (A) Negative  POCT urine pregnancy     Status: Normal  Collection Time: 06/29/17 11:31 AM  Result Value Ref Range   Preg Test, Ur Negative Negative  POCT Microscopic Urinalysis ([facility])     Status: Abnormal   Collection Time: 06/29/17 11:36 AM  Result Value Ref Range   WBC,UR,HPF,POC Moderate (A) None WBC/hpf   RBC,UR,HPF,POC Too numerous to count  (A) None RBC/hpf   Bacteria Few (A) None, Too numerous to count   Mucus Absent Absent   Epithelial Cells, UR Per Microscopy Few (A) None, Too numerous to count cells/hpf   Pt was witnessed by a nurse stumbling and falling to her knee after exiting the bathroom. I evaluated pt when she was in the room. No injuries noted. Normal knee exam. Denies hitting her head. Had positive Marye Round to the right. Epley maneuver performed successfully. Pt reports improvement in dizziness.  Assessment and Plan :  This case was precepted with Dr. Mitchel Honour. Pt has benign PE and VS are stable, no red flag signs or sx. Normal WBC, negative preg, UA with trace leuks and many WBCs, considering urinary sx, could be early UTI, will treat empirically and send off urine culture. Pt has PMH of anemia, hemoglobin today is 9.9, which has actually increased since her last visit.  Current visit is more consistent with BPPV.  Positive Dix-Hallpike to the right.  Dizziness improved with Epley maneuver.  Will provide treatment for dizziness and nausea.  Labs pending.  Patient encouraged to take medications as prescribed, increase oral fluids, advance diet as tolerated, apply heating pad to stomach, and follow-up as needed. Of note, patient became upset whenever I would not give her a work note to be out for 5 days.  Kept insisting that her work requires her to be out for 5 days if she is sick  and pointed to a pack of papers that I would need to fill out to allow her to be out for 5 days (i.e.,FMLA forms).  I educated patient that with proper treatment, rest, and hydration, she should be able to return to work fully in 2 days. If she is not better in 2 days, she should return to office to be reevaluated. 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  I encouraged patient that she will need to discuss this with her primary care provider who follows her for anemia. If she does not have a PCP, encouraged her to establish here for further evaluation and management of her chronic anemia.  Interestingly, pt appeared uncomfortable lying on exam table upon first presentation but was in no apparent distress at all when discussing FMLA forms. She then informed our nurse that if we were not going to give her 5 days off and complete the paperwork, she will just go to the ED.   1. Benign paroxysmal positional vertigo of right ear - meclizine (ANTIVERT) 25 MG tablet; Take 1 tablet (25 mg total) by mouth 3 (three) times daily as needed for dizziness.  Dispense: 30 tablet; Refill: 0  2. Dizziness - POCT CBC - POCT glucose (manual entry) - CMP14+EGFR - TSH - Orthostatic vital signs - Orthostatic vital signs  3. Anemia, unspecified type - POCT CBC - Pathologist smear review - Iron, TIBC and Ferritin Panel  4. Urinary urgency - POCT  urinalysis dipstick  5. Leukocytes in urine - POCT Microscopic Urinalysis ([facility]) - Urine Culture - cephALEXin (KEFLEX) 500 MG capsule; Take 1 capsule (500 mg total) by mouth 2 (two) times daily for 7 days.  Dispense: 14 capsule; Refill: 0  6. Nausea without vomiting - POCT urine pregnancy - ondansetron (ZOFRAN-ODT) 8 MG disintegrating tablet; Take 1 tablet  (8 mg total) by mouth every 8 (eight) hours as needed for nausea.  Dispense: 20 tablet; Refill: 0   Tenna Delaine PA-C  [facility] 06/29/2017 11:43 AM

## 2017-06-29 NOTE — Patient Instructions (Addendum)
Your urine test are suggestive of UTI.  I recommend we treat with antibiotic at this time.  I have also given you some medication to take for nausea. Your blood count shows that you are anemic.  I recommend taking your iron like you are supposed to.  We are checking your iron levels at this time.  If any of your symptoms worsen or you develop worsening abdominal pain, vomiting, fever, chills, or other concerning symptoms please seek care immediately.    Your symptoms could also be related to menstrual cramping.  Please use heating pad to the affected area. Thank you for letting me participate in your health and well being.   For dizziness, please try epley maneuver. You may use meclizine for dizziness and zofran for nausea.    Benign Positional Vertigo Vertigo is the feeling that you or your surroundings are moving when they are not. Benign positional vertigo is the most common form of vertigo. The cause of this condition is not serious (is benign). This condition is triggered by certain movements and positions (is positional). This condition can be dangerous if it occurs while you are doing something that could endanger you or others, such as driving. What are the causes? In many cases, the cause of this condition is not known. It may be caused by a disturbance in an area of the inner ear that helps your brain to sense movement and balance. This disturbance can be caused by a viral infection (labyrinthitis), head injury, or repetitive motion. What increases the risk? This condition is more likely to develop in:  Women.  People who are 20 years of age or older.  What are the signs or symptoms? Symptoms of this condition usually happen when you move your head or your eyes in different directions. Symptoms may start suddenly, and they usually last for less than a minute. Symptoms may include:  Loss of balance and falling.  Feeling like you are spinning or moving.  Feeling like your  surroundings are spinning or moving.  Nausea and vomiting.  Blurred vision.  Dizziness.  Involuntary eye movement (nystagmus).  Symptoms can be mild and cause only slight annoyance, or they can be severe and interfere with daily life. Episodes of benign positional vertigo may return (recur) over time, and they may be triggered by certain movements. Symptoms may improve over time. How is this diagnosed? This condition is usually diagnosed by medical history and a physical exam of the head, neck, and ears. You may be referred to a health care provider who specializes in ear, nose, and throat (ENT) problems (otolaryngologist) or a provider who specializes in disorders of the nervous system (neurologist). You may have additional testing, including:  MRI.  A CT scan.  Eye movement tests. Your health care provider may ask you to change positions quickly while he or she watches you for symptoms of benign positional vertigo, such as nystagmus. Eye movement may be tested with an electronystagmogram (ENG), caloric stimulation, the Dix-Hallpike test, or the roll test.  An electroencephalogram (EEG). This records electrical activity in your brain.  Hearing tests.  How is this treated? Usually, your health care provider will treat this by moving your head in specific positions to adjust your inner ear back to normal. Surgery may be needed in severe cases, but this is rare. In some cases, benign positional vertigo may resolve on its own in 2-4 weeks. Follow these instructions at home: Safety  Move slowly.Avoid sudden body or head movements.  Avoid driving.  Avoid operating heavy machinery.  Avoid doing any tasks that would be dangerous to you or others if a vertigo episode would occur.  If you have trouble walking or keeping your balance, try using a cane for stability. If you feel dizzy or unstable, sit down right away.  Return to your normal activities as told by your health care provider.  Ask your health care provider what activities are safe for you. General instructions  Take over-the-counter and prescription medicines only as told by your health care provider.  Avoid certain positions or movements as told by your health care provider.  Drink enough fluid to keep your urine clear or pale yellow.  Keep all follow-up visits as told by your health care provider. This is important. Contact a health care provider if:  You have a fever.  Your condition gets worse or you develop new symptoms.  Your family or friends notice any behavioral changes.  Your nausea or vomiting gets worse.  You have numbness or a "pins and needles" sensation. Get help right away if:  You have difficulty speaking or moving.  You are always dizzy.  You faint.  You develop severe headaches.  You have weakness in your legs or arms.  You have changes in your hearing or vision.  You develop a stiff neck.  You develop sensitivity to light. This information is not intended to replace advice given to you by your health care provider. Make sure you discuss any questions you have with your health care provider. Document Released: 11/16/2005 Document Revised: 07/17/2015 Document Reviewed: 06/03/2014 Elsevier Interactive Patient Education  2018 [employer].   How to Perform the Epley Maneuver The Epley maneuver is an exercise that relieves symptoms of vertigo. Vertigo is the feeling that you or your surroundings are moving when they are not. When you feel vertigo, you may feel like the room is spinning and have trouble walking. Dizziness is a little different than vertigo. When you are dizzy, you may feel unsteady or light-headed. You can do this maneuver at home whenever you have symptoms of vertigo. You can do it up to 3 times a day until your symptoms go away. Even though the Epley maneuver may relieve your vertigo for a few weeks, it is possible that your symptoms will return. This maneuver  relieves vertigo, but it does not relieve dizziness. What are the risks? If it is done correctly, the Epley maneuver is considered safe. Sometimes it can lead to dizziness or nausea that goes away after a short time. If you develop other symptoms, such as changes in vision, weakness, or numbness, stop doing the maneuver and call your health care provider. How to perform the Epley maneuver 1. Sit on the edge of a bed or table with your back straight and your legs extended or hanging over the edge of the bed or table. 2. Turn your head halfway toward the affected ear or side. 3. Lie backward quickly with your head turned until you are lying flat on your back. You may want to position a pillow under your shoulders. 4. Hold this position for 30 seconds. You may experience an attack of vertigo. This is normal. 5. Turn your head to the opposite direction until your unaffected ear is facing the floor. 6. Hold this position for 30 seconds. You may experience an attack of vertigo. This is normal. Hold this position until the vertigo stops. 7. Turn your whole body to the same side as your  head. Hold for another 30 seconds. 8. Sit back up. You can repeat this exercise up to 3 times a day. Follow these instructions at home:  After doing the Epley maneuver, you can return to your normal activities.  Ask your health care provider if there is anything you should do at home to prevent vertigo. He or she may recommend that you: ? Keep your head raised (elevated) with two or more pillows while you sleep. ? Do not sleep on the side of your affected ear. ? Get up slowly from bed. ? Avoid sudden movements during the day. ? Avoid extreme head movement, like looking up or bending over. Contact a health care provider if:  Your vertigo gets worse.  You have other symptoms, including: ? Nausea. ? Vomiting. ? Headache. Get help right away if:  You have vision changes.  You have a severe or worsening headache  or neck pain.  You cannot stop vomiting.  You have new numbness or weakness in any part of your body. Summary  Vertigo is the feeling that you or your surroundings are moving when they are not.  The Epley maneuver is an exercise that relieves symptoms of vertigo.  If the Epley maneuver is done correctly, it is considered safe. You can do it up to 3 times a day. This information is not intended to replace advice given to you by your health care provider. Make sure you discuss any questions you have with your health care provider. Document Released: 02/13/2013 Document Revised: 12/30/2015 Document Reviewed: 12/30/2015 Elsevier Interactive Patient Education  2017 [employer].   IF you received an x-ray today, you will receive an invoice from [facility]. Please contact [facility] at [phone] with questions or concerns regarding your invoice.   IF you received labwork today, you will receive an invoice from [facility]. Please contact [facility] at [phone] with questions or concerns regarding your invoice.   Our billing staff will not be able to assist you with questions regarding bills from these companies.  You will be contacted with the lab results as soon as they are available. The fastest way to get your results is to activate your My Chart account. Instructions are located on the last page of this paperwork. If you have not heard from [place] regarding the results in 2 weeks, please contact this office.

## 2017-06-30 LAB — CMP14+EGFR
ALBUMIN: 3.8 g/dL (ref 3.5–5.5)
ALT: 10 IU/L (ref 0–32)
AST: 15 IU/L (ref 0–40)
Albumin/Globulin Ratio: 1.2 (ref 1.2–2.2)
Alkaline Phosphatase: 78 IU/L (ref 39–117)
BUN/Creatinine Ratio: 12 (ref 9–23)
BUN: 7 mg/dL (ref 6–24)
Bilirubin Total: <0.2 mg/dL (ref 0.0–1.2)
CALCIUM: 8.9 mg/dL (ref 8.7–10.2)
CHLORIDE: 103 mmol/L (ref 96–106)
CO2: 24 mmol/L (ref 20–29)
CREATININE: 0.57 mg/dL (ref 0.57–1.00)
GFR calc non Af Amer: 112 mL/min/{1.73_m2} (ref >59)
GFR, EST AFRICAN AMERICAN: 129 mL/min/{1.73_m2} (ref >59)
GLOBULIN, TOTAL: 3.2 g/dL (ref 1.5–4.5)
Glucose: 97 mg/dL (ref 65–99)
POTASSIUM: 4.1 mmol/L (ref 3.5–5.2)
Sodium: 139 mmol/L (ref 134–144)
Total Protein: 7 g/dL (ref 6.0–8.5)

## 2017-06-30 LAB — IRON,TIBC AND FERRITIN PANEL
Ferritin: 23 ng/mL (ref 15–150)
IRON: 27 ug/dL (ref 27–159)
Iron Saturation: 9 % — CL (ref 15–55)
TIBC: 303 ug/dL (ref 250–450)
UIBC: 276 ug/dL (ref 131–425)

## 2017-06-30 LAB — TSH: TSH: 2.05 u[IU]/mL (ref 0.450–4.500)

## 2017-07-01 ENCOUNTER — Encounter: Payer: Self-pay | Admitting: Physician Assistant

## 2017-07-01 LAB — URINE CULTURE

## 2017-07-04 ENCOUNTER — Emergency Department (HOSPITAL_COMMUNITY)
Admission: EM | Admit: 2017-07-04 | Discharge: 2017-07-04 | Disposition: A | Discharge status: Home / Self Care | Payer: BLUE CROSS/BLUE SHIELD | Attending: Emergency Medicine | Admitting: Emergency Medicine

## 2017-07-04 ENCOUNTER — Encounter (HOSPITAL_COMMUNITY): Payer: Self-pay | Admitting: Emergency Medicine

## 2017-07-04 ENCOUNTER — Emergency Department (HOSPITAL_COMMUNITY): Payer: BLUE CROSS/BLUE SHIELD

## 2017-07-04 DIAGNOSIS — R0602 Shortness of breath: Secondary | ICD-10-CM | POA: Insufficient documentation

## 2017-07-04 DIAGNOSIS — Z79899 Other long term (current) drug therapy: Secondary | ICD-10-CM | POA: Diagnosis not present

## 2017-07-04 DIAGNOSIS — J45909 Unspecified asthma, uncomplicated: Secondary | ICD-10-CM | POA: Diagnosis not present

## 2017-07-04 HISTORY — DX: Unspecified asthma, uncomplicated: J45.909

## 2017-07-04 LAB — CBC
HCT: 31.7 % — ABNORMAL LOW (ref 36.0–46.0)
Hemoglobin: 9.7 g/dL — ABNORMAL LOW (ref 12.0–15.0)
MCH: 18.3 pg — AB (ref 26.0–34.0)
MCHC: 30.6 g/dL (ref 30.0–36.0)
MCV: 59.8 fL — ABNORMAL LOW (ref 78.0–100.0)
Platelets: 338 10*3/uL (ref 150–400)
RBC: 5.3 MIL/uL — AB (ref 3.87–5.11)
RDW: 15.2 % (ref 11.5–15.5)
WBC: 3.6 10*3/uL — ABNORMAL LOW (ref 4.0–10.5)

## 2017-07-04 LAB — BASIC METABOLIC PANEL
ANION GAP: 10 (ref 5–15)
BUN: 14 mg/dL (ref 6–20)
CO2: 23 mmol/L (ref 22–32)
CREATININE: 0.72 mg/dL (ref 0.44–1.00)
Calcium: 9 mg/dL (ref 8.9–10.3)
Chloride: 105 mmol/L (ref 101–111)
GFR calc Af Amer: >60 mL/min (ref >60)
Glucose, Bld: 97 mg/dL (ref 65–99)
Potassium: 3.9 mmol/L (ref 3.5–5.1)
SODIUM: 138 mmol/L (ref 135–145)

## 2017-07-04 LAB — I-STAT TROPONIN, ED: TROPONIN I, POC: 0 ng/mL (ref 0.00–0.08)

## 2017-07-04 LAB — D-DIMER, QUANTITATIVE: D-Dimer, Quant: 0.29 ug/mL-FEU (ref 0.00–0.50)

## 2017-07-04 LAB — I-STAT BETA HCG BLOOD, ED (MC, WL, AP ONLY): I-stat hCG, quantitative: <5 m[IU]/mL (ref <5)

## 2017-07-04 MED ORDER — ALBUTEROL SULFATE (2.5 MG/3ML) 0.083% IN NEBU
5.0000 mg | INHALATION_SOLUTION | Freq: Once | RESPIRATORY_TRACT | Status: AC
  Administered 2017-07-04: 5 mg via RESPIRATORY_TRACT
  Filled 2017-07-04: qty 6

## 2017-07-04 MED ORDER — ALBUTEROL SULFATE HFA 108 (90 BASE) MCG/ACT IN AERS: 1.0000 | INHALATION_SPRAY | Freq: Four times a day (QID) | RESPIRATORY_TRACT | 0 refills | Status: DC | PRN

## 2017-07-04 NOTE — Discharge Instructions (Signed)
Return for any problem.  Follow-up with your regular doctor as instructed. °

## 2017-07-04 NOTE — ED Triage Notes (Signed)
Reports having SOB and chest pain since yesterday.  Lungs clear in triage but noted to be labored.

## 2017-07-04 NOTE — ED Provider Notes (Signed)
[facility] EMERGENCY DEPARTMENT Provider Note   CSN: [id number] Arrival date & time: 07/04/17  0532     History   Chief Complaint Chief Complaint  Patient presents with  . Shortness of Breath    HPI Holly Newton is a 47 y.o. female.  47 year old female with prior history of anemia and asthma who presents with complaint of shortness of breath.  Patient reports that her symptoms started yesterday.  She now feels improved today.  Symptoms reported are consistent with her prior Asthma exacerbations. She denies any associated fever, chest pain, nausea, vomiting, syncope, or other complaint.  She reports that she was out of her albuterol and was unable to take anything at home for her symptoms.  At the time of my exam, she now feels improved.  She denies chest pain to me. She apparently reported chest pain to Triage.   The history is provided by the patient.  Shortness of Breath  This is a new problem. The average episode lasts 1 day. The problem occurs rarely.The current episode started yesterday. The problem has been gradually improving. Pertinent negatives include no fever, no chest pain and no leg pain. She has tried nothing for the symptoms. The treatment provided no relief. She has had no prior hospitalizations. She has had prior ED visits. She has had no prior ICU admissions. Associated medical issues include asthma.    Past Medical History:  Diagnosis Date  . Anemia   . Anemia   . Asthma   . Bleeding hemorrhoid     Patient Active Problem List   Diagnosis Date Noted  . History of anemia 04/26/2017  . Dizziness 04/26/2017  . Hemorrhoids 08/14/2010    History reviewed. No pertinent surgical history.   OB History    Gravida  5   Para  5   Term  5   Preterm  0   AB  0   Living  5     SAB  0   TAB  0   Ectopic  0   Multiple  0   Live Births  5            Home Medications    Prior to Admission medications   Medication Sig  Start Date End Date Taking? Authorizing Provider  albuterol (PROVENTIL HFA;VENTOLIN HFA) 108 (90 Base) MCG/ACT inhaler Inhale 1 puff into the lungs every 6 (six) hours as needed for wheezing or shortness of breath.   Yes [provider]  cephALEXin (KEFLEX) 500 MG capsule Take 1 capsule (500 mg total) by mouth 2 (two) times daily for 7 days. Patient not taking: Reported on 07/04/2017 06/29/17 07/06/17  Magdalene River, PA-C  meclizine (ANTIVERT) 25 MG tablet Take 1 tablet (25 mg total) by mouth 3 (three) times daily as needed for dizziness. Patient not taking: Reported on 07/04/2017 06/29/17   Benjiman Core D, PA-C  ondansetron (ZOFRAN-ODT) 8 MG disintegrating tablet Take 1 tablet (8 mg total) by mouth every 8 (eight) hours as needed for nausea. Patient not taking: Reported on 07/04/2017 06/29/17   Magdalene River, PA-C    Family History No family history on file.  Social History Social History   Tobacco Use  . Smoking status: Never Smoker  . Smokeless tobacco: Never Used  Substance Use Topics  . Alcohol use: No  . Drug use: No     Allergies   Patient has no known allergies.   Review of Systems Review of Systems  Constitutional: Negative for fever.  Respiratory: Positive for shortness of breath.   Cardiovascular: Negative for chest pain.  All other systems reviewed and are negative.    Physical Exam Updated Vital Signs BP 112/70 (BP Location: Right Arm)   Pulse 60   Temp 98 F (36.7 C) (Oral)   Resp 16   Ht 5' 4.5" (1.638 m)   Wt 84.8 kg (187 lb)   LMP 06/28/2017 (Exact Date)   SpO2 100%   BMI 31.60 kg/m   Physical Exam  Constitutional: She is oriented to person, place, and time. She appears well-developed and well-nourished. No distress.  HENT:  Head: Normocephalic and atraumatic.  Mouth/Throat: Oropharynx is clear and moist.  Eyes: Pupils are equal, round, and reactive to light. Conjunctivae and EOM are normal.  Neck: Normal range of motion. Neck  supple.  Cardiovascular: Normal rate, regular rhythm and normal heart sounds.  Pulmonary/Chest: Effort normal and breath sounds normal. No respiratory distress.  Abdominal: Soft. She exhibits no distension. There is no tenderness.  Musculoskeletal: Normal range of motion. She exhibits no edema or deformity.  Neurological: She is alert and oriented to person, place, and time.  Skin: Skin is warm and dry.  Psychiatric: She has a normal mood and affect.  Nursing note and vitals reviewed.    ED Treatments / Results  Labs (all labs ordered are listed, but only abnormal results are displayed) Labs Reviewed  CBC - Abnormal; Notable for the following components:      Result Value   WBC 3.6 (*)    RBC 5.30 (*)    Hemoglobin 9.7 (*)    HCT 31.7 (*)    MCV 59.8 (*)    MCH 18.3 (*)    All other components within normal limits  BASIC METABOLIC PANEL  D-DIMER, QUANTITATIVE (NOT AT [facility])  I-STAT TROPONIN, ED  I-STAT BETA HCG BLOOD, ED ([facility], [facility], [facility] ONLY)    EKG EKG Interpretation  Date/Time:  Monday Jul 04 2017 05:45:33 EDT Ventricular Rate:  69 PR Interval:  142 QRS Duration: 72 QT Interval:  392 QTC Calculation: 420 R Axis:   86 Text Interpretation:  Normal sinus rhythm Normal ECG Confirmed by Kristine Royal [phone]) on 07/04/2017 12:17:09 PM   Radiology Dg Chest 2 View  Result Date: 07/04/2017 CLINICAL DATA:  Acute onset of shortness of breath and central chest pain. EXAM: CHEST - 2 VIEW COMPARISON:  Thoracic spine radiographs performed 03/18/2012 FINDINGS: The lungs are well-aerated and clear. There is no evidence of focal opacification, pleural effusion or pneumothorax. The heart is normal in size; the mediastinal contour is within normal limits. No acute osseous abnormalities are seen. IMPRESSION: No acute cardiopulmonary process seen. Electronically Signed   By: Roanna Raider M.D.   On: 07/04/2017 06:48    Procedures Procedures (including critical care time)  Medications  Ordered in ED Medications  albuterol (PROVENTIL) (2.5 MG/3ML) 0.083% nebulizer solution 5 mg (5 mg Nebulization Given 07/04/17 0552)     Initial Impression / Assessment and Plan / ED Course  I have reviewed the triage vital signs and the nursing notes.  Pertinent labs & imaging results that were available during my care of the patient were reviewed by me and considered in my medical decision making (see chart for details).     MDM  Screen complete  Patient is presenting with symptoms reportedly consistent with her prior episodes of asthma exacerbations.  She appears to be out of albuterol.  Screening labs are without significant  acute abnormality.  EKG is without acute signs of ischemia.  Troponin is  negative. Chest x-ray is clear and without signs of acute abnormality.  Reported symptoms are not consistent with ACS or PE.  Of note, d-dimer is also negative.  Patient feels improved at time of discharge.  She understands need for close follow-up.  Return precautions are given and understood.  Final Clinical Impressions(s) / ED Diagnoses   Final diagnoses:  Shortness of breath    ED Discharge Orders        Ordered    albuterol (PROVENTIL HFA;VENTOLIN HFA) 108 (90 Base) MCG/ACT inhaler  Every 6 hours PRN     07/04/17 1309       Wynetta Fines, MD 07/04/17 1310

## 2017-07-04 NOTE — ED Notes (Signed)
Main lab to add on d dimer 

## 2017-07-06 LAB — PATHOLOGIST SMEAR REVIEW
Basophils Absolute: 0 10*3/uL (ref 0.0–0.2)
Basos: 0 %
EOS (ABSOLUTE): 0.1 10*3/uL (ref 0.0–0.4)
Eos: 2 %
HEMATOCRIT: 32.8 % — AB (ref 34.0–46.6)
Hemoglobin: 9.8 g/dL — ABNORMAL LOW (ref 11.1–15.9)
IMMATURE GRANS (ABS): 0 10*3/uL (ref 0.0–0.1)
IMMATURE GRANULOCYTES: 0 %
LYMPHS: 46 %
Lymphocytes Absolute: 1.5 10*3/uL (ref 0.7–3.1)
MCH: 18.3 pg — ABNORMAL LOW (ref 26.6–33.0)
MCHC: 29.9 g/dL — ABNORMAL LOW (ref 31.5–35.7)
MCV: 61 fL — ABNORMAL LOW (ref 79–97)
MONOS ABS: 0.3 10*3/uL (ref 0.1–0.9)
Monocytes: 9 %
NEUTROS PCT: 43 %
Neutrophils Absolute: 1.4 10*3/uL (ref 1.4–7.0)
PATH REV PLTS: NORMAL
Platelets: 356 10*3/uL (ref 150–379)
RBC: 5.36 x10E6/uL — ABNORMAL HIGH (ref 3.77–5.28)
RDW: 17.2 % — AB (ref 12.3–15.4)
WBC: 3.2 10*3/uL — AB (ref 3.4–10.8)

## 2017-07-07 ENCOUNTER — Other Ambulatory Visit: Payer: Self-pay | Admitting: Physician Assistant

## 2017-07-07 DIAGNOSIS — D649 Anemia, unspecified: Secondary | ICD-10-CM

## 2017-07-07 NOTE — Progress Notes (Signed)
Orders Placed This Encounter  Procedures  . Ambulatory referral to Hematology    Referral Priority:   Routine    Referral Type:   Consultation    Referral Reason:   Specialty Services Required    Requested Specialty:   Oncology    Number of Visits Requested:   1    

## 2017-07-08 ENCOUNTER — Ambulatory Visit: Payer: BLUE CROSS/BLUE SHIELD | Admitting: Obstetrics

## 2017-07-12 ENCOUNTER — Encounter: Payer: Self-pay | Admitting: *Deleted

## 2017-07-12 ENCOUNTER — Telehealth: Payer: Self-pay | Admitting: *Deleted

## 2017-07-12 NOTE — Telephone Encounter (Signed)
Lm for patient to call for lab results crm in

## 2017-07-22 ENCOUNTER — Encounter: Payer: Self-pay | Admitting: Oncology

## 2017-07-22 ENCOUNTER — Telehealth: Payer: Self-pay | Admitting: Internal Medicine

## 2017-07-22 ENCOUNTER — Encounter: Payer: Self-pay | Admitting: Internal Medicine

## 2017-07-22 ENCOUNTER — Telehealth: Payer: Self-pay | Admitting: Oncology

## 2017-07-22 NOTE — Telephone Encounter (Signed)
Pt's appt has been rescheduled for the pt to see Dr. Arbutus Ped on 6/17 at 1130am. Will notify the referring office of the change.

## 2017-07-22 NOTE — Telephone Encounter (Signed)
A hematology referral has been sent from [facility] for dx of anemia. Pt has been scheduled to see Clenton PareKristin Curcio on 6/18 at 1pm. Informed the referring office to call the pt and make aware of the appt date and time. Letter mailed.

## 2017-08-08 ENCOUNTER — Encounter: Payer: BLUE CROSS/BLUE SHIELD | Admitting: Internal Medicine

## 2017-08-09 ENCOUNTER — Encounter: Payer: BLUE CROSS/BLUE SHIELD | Admitting: Oncology

## 2018-02-15 ENCOUNTER — Observation Stay (HOSPITAL_COMMUNITY)
Admission: EM | Admit: 2018-02-15 | Discharge: 2018-02-17 | Disposition: A | Discharge status: Home / Self Care | Payer: Self-pay | Attending: Nephrology | Admitting: Nephrology

## 2018-02-15 ENCOUNTER — Emergency Department (HOSPITAL_COMMUNITY): Payer: Self-pay

## 2018-02-15 DIAGNOSIS — R42 Dizziness and giddiness: Secondary | ICD-10-CM

## 2018-02-15 DIAGNOSIS — Z862 Personal history of diseases of the blood and blood-forming organs and certain disorders involving the immune mechanism: Secondary | ICD-10-CM

## 2018-02-15 DIAGNOSIS — R55 Syncope and collapse: Principal | ICD-10-CM | POA: Insufficient documentation

## 2018-02-15 DIAGNOSIS — N390 Urinary tract infection, site not specified: Secondary | ICD-10-CM | POA: Insufficient documentation

## 2018-02-15 DIAGNOSIS — E860 Dehydration: Secondary | ICD-10-CM | POA: Diagnosis present

## 2018-02-15 DIAGNOSIS — D509 Iron deficiency anemia, unspecified: Secondary | ICD-10-CM | POA: Diagnosis present

## 2018-02-15 DIAGNOSIS — R4189 Other symptoms and signs involving cognitive functions and awareness: Secondary | ICD-10-CM

## 2018-02-15 DIAGNOSIS — Z79899 Other long term (current) drug therapy: Secondary | ICD-10-CM | POA: Insufficient documentation

## 2018-02-15 DIAGNOSIS — R404 Transient alteration of awareness: Secondary | ICD-10-CM | POA: Diagnosis present

## 2018-02-15 LAB — CBC WITH DIFFERENTIAL/PLATELET
Abs Immature Granulocytes: 0.01 K/uL (ref 0.00–0.07)
Basophils Absolute: 0 K/uL (ref 0.0–0.1)
Basophils Relative: 1 %
Eosinophils Absolute: 0.1 K/uL (ref 0.0–0.5)
Eosinophils Relative: 1 %
HCT: 30.9 % — ABNORMAL LOW (ref 36.0–46.0)
Hemoglobin: 9.3 g/dL — ABNORMAL LOW (ref 12.0–15.0)
Immature Granulocytes: 0 %
Lymphocytes Relative: 42 %
Lymphs Abs: 1.9 K/uL (ref 0.7–4.0)
MCH: 18.3 pg — ABNORMAL LOW (ref 26.0–34.0)
MCHC: 30.1 g/dL (ref 30.0–36.0)
MCV: 60.9 fL — ABNORMAL LOW (ref 80.0–100.0)
Monocytes Absolute: 0.4 K/uL (ref 0.1–1.0)
Monocytes Relative: 9 %
Neutro Abs: 2.1 K/uL (ref 1.7–7.7)
Neutrophils Relative %: 47 %
Platelets: 307 K/uL (ref 150–400)
RBC: 5.07 MIL/uL (ref 3.87–5.11)
RDW: 15.6 % — ABNORMAL HIGH (ref 11.5–15.5)
WBC: 4.4 K/uL (ref 4.0–10.5)
nRBC: 0 % (ref 0.0–0.2)

## 2018-02-15 LAB — I-STAT TROPONIN, ED: Troponin i, poc: 0 ng/mL (ref 0.00–0.08)

## 2018-02-15 LAB — I-STAT BETA HCG BLOOD, ED (MC, WL, AP ONLY): I-stat hCG, quantitative: <5 m[IU]/mL

## 2018-02-15 LAB — I-STAT VENOUS BLOOD GAS, ED
Acid-base deficit: 1 mmol/L (ref 0.0–2.0)
Bicarbonate: 24.2 mmol/L (ref 20.0–28.0)
O2 Saturation: 67 %
PCO2 VEN: 39.6 mmHg — AB (ref 44.0–60.0)
TCO2: 25 mmol/L (ref 22–32)
pH, Ven: 7.394 (ref 7.250–7.430)
pO2, Ven: 35 mmHg (ref 32.0–45.0)

## 2018-02-15 LAB — ETHANOL

## 2018-02-15 LAB — I-STAT CG4 LACTIC ACID, ED: Lactic Acid, Venous: 0.56 mmol/L (ref 0.5–1.9)

## 2018-02-15 LAB — CBG MONITORING, ED: GLUCOSE-CAPILLARY: 90 mg/dL (ref 70–99)

## 2018-02-15 MED ORDER — SODIUM CHLORIDE 0.9 % IV SOLN
1000.0000 mL | INTRAVENOUS | Status: DC
  Administered 2018-02-15: 1000 mL via INTRAVENOUS

## 2018-02-15 NOTE — ED Notes (Signed)
Patient able to speak currently, pt does not know what happens. Pt does recall not being able to respond to us

## 2018-02-15 NOTE — ED Provider Notes (Signed)
[facility]ERGENC[facility]PART[facility] Provider Note   CSN: [id number] Arrival date & time: 02/15/18  2254     History   Chief Complaint Chief Complaint  Patient presents with  . Weakness    HPI Holly Newton is a 47 y.o. female.  Level 5 caveat for altered mental status and language barrier.  Patient brought in through the front doors by family.  Family is not available.  Patient is minimally responsive but breathing on her own has a pulse.  She feels warm to the touch.  She is not speaking and not moving spontaneously. CBG is 90. No other medical history or HPI available.  The history is provided by the patient and the spouse. The history is limited by the condition of the patient and a language barrier.  Weakness     Past Medical History:  Diagnosis Date  . Anemia   . Anemia   . Asthma   . Bleeding hemorrhoid     Patient Active Problem List   Diagnosis Date Noted  . History of anemia 04/26/2017  . Dizziness 04/26/2017  . Hemorrhoids 08/14/2010    No past surgical history on file.   OB History    Gravida  5   Para  5   Term  5   Preterm  0   AB  0   Living  5     SAB  0   TAB  0   Ectopic  0   Multiple  0   Live Births  5            Home Medications    Prior to Admission medications   Medication Sig Start Date End Date Taking? Authorizing Provider  albuterol (PROVENTIL HFA;VENTOLIN HFA) 108 (90 Base) MCG/ACT inhaler Inhale 1 puff into the lungs every 6 (six) hours as needed for wheezing or shortness of breath.    [provider]  albuterol (PROVENTIL HFA;VENTOLIN HFA) 108 (90 Base) MCG/ACT inhaler Inhale 1-2 puffs into the lungs every 6 (six) hours as needed for wheezing or shortness of breath. 07/04/17   Wynetta FinesMessick, Peter C, MD  meclizine (ANTIVERT) 25 MG tablet Take 1 tablet (25 mg total) by mouth 3 (three) times daily as needed for dizziness. Patient not taking: Reported on 07/04/2017 06/29/17   Benjiman CoreWiseman, Brittany  D, PA-C  ondansetron (ZOFRAN-ODT) 8 MG disintegrating tablet Take 1 tablet (8 mg total) by mouth every 8 (eight) hours as needed for nausea. Patient not taking: Reported on 07/04/2017 06/29/17   Magdalene RiverWiseman, Brittany D, PA-C    Family History No family history on file.  Social History Social History   Tobacco Use  . Smoking status: Never Smoker  . Smokeless tobacco: Never Used  Substance Use Topics  . Alcohol use: No  . Drug use: No     Allergies   Patient has no known allergies.   Review of Systems Review of Systems  Unable to perform ROS: Mental status change  Neurological: Positive for weakness.     Physical Exam Updated Vital Signs BP (!) 140/94   Pulse (!) 101   Resp (!) 22   SpO2 100%   Physical Exam Vitals signs and nursing note reviewed.  Constitutional:      General: She is not in acute distress.    Appearance: She is well-developed. She is obese. She is diaphoretic.     Comments:  Warm to the touch, breathing spontaneously, responds to pain, no speech  HENT:  Head: Normocephalic and atraumatic.     Mouth/Throat:     Pharynx: No oropharyngeal exudate.  Eyes:     Conjunctiva/sclera: Conjunctivae normal.     Pupils: Pupils are equal, round, and reactive to light.  Neck:     Musculoskeletal: Normal range of motion and neck supple.     Comments: No meningismus. Cardiovascular:     Rate and Rhythm: Normal rate and regular rhythm.     Heart sounds: Normal heart sounds. No murmur.  Pulmonary:     Effort: Pulmonary effort is normal. No respiratory distress.     Breath sounds: Normal breath sounds.  Abdominal:     Palpations: Abdomen is soft.     Tenderness: There is no abdominal tenderness. There is no guarding or rebound.  Musculoskeletal: Normal range of motion.        General: No tenderness.  Skin:    General: Skin is warm.     Findings: Rash present.     Comments: Erythema to neck and face  Neurological:     Mental Status: She is alert.      Cranial Nerves: No cranial nerve deficit.     Motor: No abnormal muscle tone.     Coordination: Coordination normal.     Comments: Patient initially has no spontaneous movement.  She does not speak.  She is breathing on her own.  She responds to pain in all of her extremities.  Psychiatric:        Behavior: Behavior normal.      ED Treatments / Results  Labs (all labs ordered are listed, but only abnormal results are displayed) Labs Reviewed  COMPREHENSIVE METABOLIC PANEL - Abnormal; Notable for the following components:      Result Value   Calcium 8.8 (*)    All other components within normal limits  CBC WITH DIFFERENTIAL/PLATELET - Abnormal; Notable for the following components:   Hemoglobin 9.3 (*)    HCT 30.9 (*)    MCV 60.9 (*)    MCH 18.3 (*)    RDW 15.6 (*)    All other components within normal limits  URINALYSIS, ROUTINE W REFLEX MICROSCOPIC - Abnormal; Notable for the following components:   Color, Urine STRAW (*)    APPearance HAZY (*)    Hgb urine dipstick SMALL (*)    Leukocytes, UA LARGE (*)    Bacteria, UA FEW (*)    Trichomonas, UA PRESENT (*)    All other components within normal limits  I-STAT VENOUS BLOOD GAS, ED - Abnormal; Notable for the following components:   pCO2, Ven 39.6 (*)    All other components within normal limits  CULTURE, BLOOD (ROUTINE X 2)  CULTURE, BLOOD (ROUTINE X 2)  URINE CULTURE  AMMONIA  ETHANOL  RAPID URINE DRUG SCREEN, HOSP PERFORMED  BLOOD GAS, VENOUS  INFLUENZA PANEL BY PCR (TYPE A & B)  URINE DRUGS OF ABUSE SCREEN W ALC, ROUTINE (REF LAB)  HIV ANTIBODY (ROUTINE TESTING W REFLEX)  I-STAT CG4 LACTIC ACID, ED  I-STAT TROPONIN, ED  I-STAT BETA HCG BLOOD, ED ([facility], [facility], [facility] ONLY)  CBG MONITORING, ED  I-STAT CG4 LACTIC ACID, ED    EKG EKG Interpretation  Date/Time:  Wednesday February 15 2018 23:08:29 EST Ventricular Rate:  96 PR Interval:    QRS Duration: 85 QT Interval:  358 QTC Calculation: 453 R Axis:   71 Text  Interpretation:  Sinus rhythm No significant change was found Confirmed by Glynn Octave [phone]) on 02/15/2018 11:37:31 PM  Radiology Ct Head Wo Contrast  Result Date: 02/16/2018 CLINICAL DATA:  Altered level of consciousness EXAM: CT HEAD WITHOUT CONTRAST TECHNIQUE: Contiguous axial images were obtained from the base of the skull through the vertex without intravenous contrast. COMPARISON:  03/17/2012 FINDINGS: Brain: No evidence of acute infarction, hemorrhage, hydrocephalus, extra-axial collection or mass lesion/mass effect. Vascular: No hyperdense vessel or unexpected calcification. Skull: Normal. Negative for fracture or focal lesion. Sinuses/Orbits: No acute finding. Other: None. IMPRESSION: Normal head CT. Electronically Signed   By: Alcide CleverMark  Lukens M.D.   On: 02/16/2018 00:29   Dg Chest Port 1 View  Result Date: 02/15/2018 CLINICAL DATA:  Acute onset of shortness of breath. Patient unresponsive. EXAM: PORTABLE CHEST 1 VIEW COMPARISON:  Chest radiograph performed 07/04/2017 FINDINGS: The lungs are well-aerated and clear. There is no evidence of focal opacification, pleural effusion or pneumothorax. The cardiomediastinal silhouette is within normal limits. No acute osseous abnormalities are seen. IMPRESSION: No acute cardiopulmonary process seen. Electronically Signed   By: Roanna RaiderJeffery  Chang M.D.   On: 02/15/2018 23:33    Procedures Procedures (including critical care time)  Medications Ordered in ED Medications  0.9 %  sodium chloride infusion (1,000 mLs Intravenous New Bag/Given 02/15/18 2323)     Initial Impression / Assessment and Plan / ED Course  I have reviewed the triage vital signs and the nursing notes.  Pertinent labs & imaging results that were available during my care of the patient were reviewed by me and considered in my medical decision making (see chart for details).    Patient brought in with altered mental status.  Unclear circumstances.  Her vitals are stable.   Rectal temperature is obtained and is normal.  CBG is 90  Patient's husband has arrived.  He does not speak [place].  With the translator, he states that patient has a history of asthma and sometimes her oxygen level  "gets low".  Patient was upset when her children left the house today and she had a near syncopal episode.  She did not hit her head or lose consciousness.  Patient now awake and answering questions.  She does not know what happened.  She denies any pain.  Labs are reassuring.  Chest x-ray is negative.  EKG is sinus rhythm  Work-up fairly unremarkable.  Patient is now awake and answering questions appropriately.  Patient does not recall being unable to respond to us.  She is alert and oriented.  She denies pain.  She is moving all of her extremities. Afebrile.   CT head is negative.  Urinalysis and drug screen are pending.  Patient states she was under a lot of stress today at home with lots of cleaning and cooking.  She also became sad when her children left the house today.  She is uncertain exactly what happened but states this has happened before when she has had episodes of anxiety.  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 arrhythmias and seizure will need to be ruled out.  Observation admission discussed with Dr. Julian ReilGardner.  Angiocath insertion Performed by: Glynn OctaveStephen Tracyann Duffell  Consent: Verbal consent obtained. Risks and benefits: risks, benefits and alternatives were discussed Time out: Immediately prior to procedure a "time out" was called to verify the correct patient, procedure, equipment, support staff and site/side marked as required.  Preparation: Patient was prepped and draped in the usual sterile fashion.  Vein Location: Right EJ  Not ultrasound Guided  Gauge: 18  Normal blood return and flush without difficulty Patient tolerance: Patient  tolerated the procedure well with no immediate complications.  CRITICAL CARE Performed by: Glynn Octave Total critical care time: 35 minutes Critical care time was exclusive of separately billable procedures and treating other patients. Critical care was necessary to treat or prevent imminent or life-threatening deterioration. Critical care was time spent personally by me on the following activities: development of treatment plan with patient and/or surrogate as well as nursing, discussions with consultants, evaluation of patient's response to treatment, examination of patient, obtaining history from patient or surrogate, ordering and performing treatments and interventions, ordering and review of laboratory studies, ordering and review of radiographic studies, pulse oximetry and re-evaluation of patient's condition.   Final Clinical Impressions(s) / ED Diagnoses   Final diagnoses:  Unresponsive episode    ED Discharge Orders    None       Tarahji Ramthun, Jeannett Senior, MD 02/16/18 [phone]

## 2018-02-15 NOTE — ED Triage Notes (Signed)
Pt has pulse and patient non-responsive. Pt speaks arabic

## 2018-02-16 ENCOUNTER — Other Ambulatory Visit: Payer: Self-pay

## 2018-02-16 ENCOUNTER — Observation Stay (HOSPITAL_BASED_OUTPATIENT_CLINIC_OR_DEPARTMENT_OTHER): Payer: Self-pay

## 2018-02-16 ENCOUNTER — Observation Stay (HOSPITAL_COMMUNITY): Payer: Self-pay

## 2018-02-16 ENCOUNTER — Emergency Department (HOSPITAL_COMMUNITY): Payer: Self-pay

## 2018-02-16 ENCOUNTER — Encounter (HOSPITAL_COMMUNITY): Payer: Self-pay | Admitting: Internal Medicine

## 2018-02-16 DIAGNOSIS — R4189 Other symptoms and signs involving cognitive functions and awareness: Secondary | ICD-10-CM

## 2018-02-16 DIAGNOSIS — I340 Nonrheumatic mitral (valve) insufficiency: Secondary | ICD-10-CM

## 2018-02-16 LAB — URINALYSIS, ROUTINE W REFLEX MICROSCOPIC
BILIRUBIN URINE: NEGATIVE
Glucose, UA: NEGATIVE mg/dL
Ketones, ur: NEGATIVE mg/dL
Nitrite: NEGATIVE
PH: 6 (ref 5.0–8.0)
Protein, ur: NEGATIVE mg/dL
Specific Gravity, Urine: 1.008 (ref 1.005–1.030)

## 2018-02-16 LAB — RAPID URINE DRUG SCREEN, HOSP PERFORMED
AMPHETAMINES: NOT DETECTED
BENZODIAZEPINES: NOT DETECTED
Barbiturates: NOT DETECTED
Cocaine: NOT DETECTED
OPIATES: NOT DETECTED
Tetrahydrocannabinol: NOT DETECTED

## 2018-02-16 LAB — COMPREHENSIVE METABOLIC PANEL
ALBUMIN: 3.8 g/dL (ref 3.5–5.0)
ALT: 11 U/L (ref 0–44)
AST: 17 U/L (ref 15–41)
Alkaline Phosphatase: 74 U/L (ref 38–126)
Anion gap: 10 (ref 5–15)
BUN: 11 mg/dL (ref 6–20)
CALCIUM: 8.8 mg/dL — AB (ref 8.9–10.3)
CHLORIDE: 105 mmol/L (ref 98–111)
CO2: 22 mmol/L (ref 22–32)
CREATININE: 0.53 mg/dL (ref 0.44–1.00)
GFR calc Af Amer: >60 mL/min (ref >60)
GFR calc non Af Amer: >60 mL/min (ref >60)
GLUCOSE: 98 mg/dL (ref 70–99)
Potassium: 3.9 mmol/L (ref 3.5–5.1)
SODIUM: 137 mmol/L (ref 135–145)
Total Bilirubin: 0.5 mg/dL (ref 0.3–1.2)
Total Protein: 7.9 g/dL (ref 6.5–8.1)

## 2018-02-16 LAB — IRON AND TIBC
IRON: 79 ug/dL (ref 28–170)
Saturation Ratios: 28 % (ref 10.4–31.8)
TIBC: 279 ug/dL (ref 250–450)
UIBC: 200 ug/dL

## 2018-02-16 LAB — ECHOCARDIOGRAM COMPLETE
Height: 64 in
Weight: 3054.4 oz

## 2018-02-16 LAB — HIV ANTIBODY (ROUTINE TESTING W REFLEX): HIV Screen 4th Generation wRfx: NONREACTIVE

## 2018-02-16 LAB — AMMONIA: AMMONIA: 18 umol/L (ref 9–35)

## 2018-02-16 LAB — INFLUENZA PANEL BY PCR (TYPE A & B)
INFLAPCR: NEGATIVE
INFLBPCR: NEGATIVE

## 2018-02-16 LAB — GLUCOSE, CAPILLARY: Glucose-Capillary: 77 mg/dL (ref 70–99)

## 2018-02-16 MED ORDER — SODIUM CHLORIDE 0.9 % IV SOLN
1000.0000 mL | INTRAVENOUS | Status: DC
  Administered 2018-02-16 – 2018-02-17 (×3): 1000 mL via INTRAVENOUS

## 2018-02-16 MED ORDER — METRONIDAZOLE 500 MG PO TABS
2000.0000 mg | ORAL_TABLET | Freq: Once | ORAL | Status: AC
  Administered 2018-02-16: 2000 mg via ORAL
  Filled 2018-02-16: qty 4

## 2018-02-16 MED ORDER — ACETAMINOPHEN 325 MG PO TABS
650.0000 mg | ORAL_TABLET | Freq: Four times a day (QID) | ORAL | Status: DC | PRN
  Administered 2018-02-16: 650 mg via ORAL
  Filled 2018-02-16: qty 2

## 2018-02-16 MED ORDER — SODIUM CHLORIDE 0.9% FLUSH: 3.0000 mL | Freq: Two times a day (BID) | INTRAVENOUS | Status: DC

## 2018-02-16 MED ORDER — SODIUM CHLORIDE 0.9 % IV BOLUS
2000.0000 mL | Freq: Once | INTRAVENOUS | Status: AC
  Administered 2018-02-16: 2000 mL via INTRAVENOUS

## 2018-02-16 MED ORDER — SODIUM CHLORIDE 0.9 % IV SOLN
1.0000 g | INTRAVENOUS | Status: DC
  Administered 2018-02-16: 1 g via INTRAVENOUS
  Filled 2018-02-16 (×2): qty 10

## 2018-02-16 MED ORDER — ENOXAPARIN SODIUM 40 MG/0.4ML ~~LOC~~ SOLN: 40.0000 mg | Freq: Every day | SUBCUTANEOUS | Status: DC

## 2018-02-16 NOTE — ED Notes (Signed)
2nd visitor at bedside.

## 2018-02-16 NOTE — Progress Notes (Signed)
  Echocardiogram 2D Echocardiogram has been performed.  Holly Newton  Holly Newton 02/16/2018, 2:20 PM

## 2018-02-16 NOTE — ED Notes (Signed)
Pt and husband transported to CT

## 2018-02-16 NOTE — Progress Notes (Signed)
EEG complete - results pending 

## 2018-02-16 NOTE — ED Notes (Signed)
Pt taken off purewick and put on bedside commode. Pt able to stand and move independently, without assistance, and without difficulty noted or stated by pt in order to use bedside commode.

## 2018-02-16 NOTE — H&P (Signed)
History and Physical    Holly Newton ZOX:[id number] DOB: 12/19/1970 DOA: 02/15/2018  PCP: System, Pcp Not In  Patient coming from: Home  I have personally briefly reviewed patient's old medical records [facility]  Chief Complaint: Unresponsive episode  HPI: Holly Newton is a 47 y.o. female with medical history significant of previously healthy.  Patient presents to ED with AMS.  Minimally responsive, withdrawing to pain initially.  Breathing on own and has pulse.  CBG 90, BP 120s systolic, O2 sat 81%97% on RA.  Not speaking and not moving spontaneously initially.   ED Course: CT head neg.  VBG obtained and doesn't appear to be retaining CO2.  UDS pending.  Patient has since woken up, now awake and answering questions.  Of note, patient was upset when her children left the house today.   Review of Systems: As per HPI otherwise 10 point review of systems negative.   Past Medical History:  Diagnosis Date  . Anemia   . Anemia   . Asthma   . Bleeding hemorrhoid     No past surgical history on file.   reports that she has never smoked. She has never used smokeless tobacco. She reports that she does not drink alcohol or use drugs.  No Known Allergies  No family history on file. No recent illness  Prior to Admission medications   Medication Sig Start Date End Date Taking? Authorizing Provider  albuterol (PROVENTIL HFA;VENTOLIN HFA) 108 (90 Base) MCG/ACT inhaler Inhale 1-2 puffs into the lungs every 6 (six) hours as needed for wheezing or shortness of breath. 07/04/17  Yes Wynetta FinesMessick, Peter C, MD  meclizine (ANTIVERT) 25 MG tablet Take 1 tablet (25 mg total) by mouth 3 (three) times daily as needed for dizziness. Patient not taking: Reported on 07/04/2017 06/29/17   Benjiman CoreWiseman, Brittany D, PA-C  ondansetron (ZOFRAN-ODT) 8 MG disintegrating tablet Take 1 tablet (8 mg total) by mouth every 8 (eight) hours as needed for nausea. Patient not taking: Reported on 07/04/2017 06/29/17    Magdalene RiverWiseman, Brittany D, PA-C    Physical Exam: Vitals:   02/15/18 2345 02/16/18 0045 02/16/18 0100 02/16/18 0115  BP: 129/77 134/78 132/73 124/69  Pulse: 80 93 90 80  Resp: 18 (!) 21 (!) 28 16  SpO2: 100% 100% 100% 100%    Constitutional: NAD, calm, comfortable Eyes: PERRL, lids and conjunctivae normal ENMT: Mucous membranes are moist. Posterior pharynx clear of any exudate or lesions.Normal dentition.  Neck: normal, supple, no masses, no thyromegaly Respiratory: clear to auscultation bilaterally, no wheezing, no crackles. Normal respiratory effort. No accessory muscle use.  Cardiovascular: Regular rate and rhythm, no murmurs / rubs / gallops. No extremity edema. 2+ pedal pulses. No carotid bruits.  Abdomen: no tenderness, no masses palpated. No hepatosplenomegaly. Bowel sounds positive.  Musculoskeletal: no clubbing / cyanosis. No joint deformity upper and lower extremities. Good ROM, no contractures. Normal muscle tone.  Skin: no rashes, lesions, ulcers. No induration Neurologic: CN 2-12 grossly intact. Sensation intact, DTR normal. Strength 5/5 in all 4.  Psychiatric: Normal judgment and insight. Alert and oriented x 3. Normal mood.    Labs on Admission: I have personally reviewed following labs and imaging studies  CBC: Recent Labs  Lab 02/15/18 2315  WBC 4.4  NEUTROABS 2.1  HGB 9.3*  HCT 30.9*  MCV 60.9*  PLT 307   Basic Metabolic Panel: Recent Labs  Lab 02/15/18 2315  NA 137  K 3.9  CL 105  CO2 22  GLUCOSE 98  BUN 11  CREATININE 0.53  CALCIUM 8.8*   GFR: CrCl cannot be calculated (Unknown ideal weight.). Liver Function Tests: Recent Labs  Lab 02/15/18 2315  AST 17  ALT 11  ALKPHOS 74  BILITOT 0.5  PROT 7.9  ALBUMIN 3.8   No results for input(s): LIPASE, AMYLASE in the last 168 hours. Recent Labs  Lab 02/15/18 2315  AMMONIA 18   Coagulation Profile: No results for input(s): INR, PROTIME in the last 168 hours. Cardiac Enzymes: No results for  input(s): CKTOTAL, CKMB, CKMBINDEX, TROPONINI in the last 168 hours. BNP (last 3 results) No results for input(s): PROBNP in the last 8760 hours. HbA1C: No results for input(s): HGBA1C in the last 72 hours. CBG: Recent Labs  Lab 02/15/18 2301  GLUCAP 90   Lipid Profile: No results for input(s): CHOL, HDL, LDLCALC, TRIG, CHOLHDL, LDLDIRECT in the last 72 hours. Thyroid Function Tests: No results for input(s): TSH, T4TOTAL, FREET4, T3FREE, THYROIDAB in the last 72 hours. Anemia Panel: No results for input(s): VITAMINB12, FOLATE, FERRITIN, TIBC, IRON, RETICCTPCT in the last 72 hours. Urine analysis:    Component Value Date/Time   BILIRUBINUR negative 06/29/2017 1130   KETONESUR negative 06/29/2017 1130   PROTEINUR =30 (A) 06/29/2017 1130   UROBILINOGEN 0.2 06/29/2017 1130   NITRITE Negative 06/29/2017 1130   LEUKOCYTESUR Trace (A) 06/29/2017 1130    Radiological Exams on Admission: Ct Head Wo Contrast  Result Date: 02/16/2018 CLINICAL DATA:  Altered level of consciousness EXAM: CT HEAD WITHOUT CONTRAST TECHNIQUE: Contiguous axial images were obtained from the base of the skull through the vertex without intravenous contrast. COMPARISON:  03/17/2012 FINDINGS: Brain: No evidence of acute infarction, hemorrhage, hydrocephalus, extra-axial collection or mass lesion/mass effect. Vascular: No hyperdense vessel or unexpected calcification. Skull: Normal. Negative for fracture or focal lesion. Sinuses/Orbits: No acute finding. Other: None. IMPRESSION: Normal head CT. Electronically Signed   By: Alcide CleverMark  Lukens M.D.   On: 02/16/2018 00:29   Dg Chest Port 1 View  Result Date: 02/15/2018 CLINICAL DATA:  Acute onset of shortness of breath. Patient unresponsive. EXAM: PORTABLE CHEST 1 VIEW COMPARISON:  Chest radiograph performed 07/04/2017 FINDINGS: The lungs are well-aerated and clear. There is no evidence of focal opacification, pleural effusion or pneumothorax. The cardiomediastinal silhouette  is within normal limits. No acute osseous abnormalities are seen. IMPRESSION: No acute cardiopulmonary process seen. Electronically Signed   By: Roanna RaiderJeffery  Chang M.D.   On: 02/15/2018 23:33    EKG: Independently reviewed.  Assessment/Plan Active Problems:   Unresponsive episode    1. Unresponsive episode - 1. Syncope pathway 2. EEG 3. 2d echo 4. Tele monitor  DVT prophylaxis: Lovenox Code Status: Full Family Communication: Husband at bedside Disposition Plan: Home after admit Consults called: None Admission status: Place in obs   Lovis More, [place] M. DO [facility] Pager [phone] Only works nights!  If 7AM-7PM, please contact the primary day team physician taking care of patient  [email or web address] Password [email or web address]  02/16/2018, 1:26 AM

## 2018-02-16 NOTE — Procedures (Signed)
History: 47 year old female being evaluated for unresponsive episode  Sedation: None  Technique: This is a 21 channel routine scalp EEG performed at the bedside with bipolar and monopolar montages arranged in accordance to the international 10/20 system of electrode placement. One channel was dedicated to EKG recording.    Background: The background consists of intermixed alpha and beta activities. There is a well defined posterior dominant rhythm of 10 hz that attenuates with eye opening. Sleep is recorded with normal appearing structures.   Photic stimulation: Physiologic driving is not performed  EEG Abnormalities: None  Clinical Interpretation: This normal EEG is recorded in the waking and sleep state. There was no seizure or seizure predisposition recorded on this study. Please note that lack of epileptiform activity on EEG does not preclude the possibility of epilepsy.   Ritta SlotMcNeill Chantry Headen, MD [facility] [phone]  If 7pm- 7am, please page neurology on call as listed in AMION.

## 2018-02-16 NOTE — Progress Notes (Signed)
[facility] Progress Note  Subjective: feels tired, "I have anemia". States hx of asthma, no other serious medical issues than those two.   Vitals:   02/16/18 0115 02/16/18 0200 02/16/18 0300 02/16/18 0320  BP: 124/69 118/70 125/86 125/76  Pulse: 80 79 76 76  Resp: 16 15 16    Temp:   (!) 97.5 F (36.4 C) (!) 97.5 F (36.4 C)  TempSrc:   Oral Oral  SpO2: 100% 100% 100% 100%  Weight:   86.6 kg   Height:   5\' 4"  (1.626 m)     Inpatient medications: . enoxaparin (LOVENOX) injection  40 mg Subcutaneous Daily  . sodium chloride flush  3 mL Intravenous Q12H   . sodium chloride 1,000 mL (02/16/18 0335)  . cefTRIAXone (ROCEPHIN)  IV    . sodium chloride       Iron/TIBC/Ferritin/ %Sat    Component Value Date/Time   IRON 27 06/29/2017 1110   TIBC 303 06/29/2017 1110   FERRITIN 23 06/29/2017 1110   IRONPCTSAT 9 (LL) 06/29/2017 1110    Exam: *Holly Newton is a 47 y.o. female with medical history significant of previously healthy.  Patient presents to ED with AMS.  Minimally responsive, withdrawing to pain initially.  Breathing on own and has pulse.  CBG 90, BP 120s systolic, O2 sat 16%97% on RA.  Not speaking and not moving spontaneously initially.   ED Course: CT head neg.  VBG obtained and doesn't appear to be retaining CO2.  UDS pending.  Patient has since woken up, now awake and answering questions.  Of note, patient was upset when her children left the house today.   Physical Exam: Constitutional: NAD, calm, comfortable Neck: normal, supple, no masses, no thyromegaly Respiratory: clear to auscultation bilaterally, no wheezing, no crackles. Normal respiratory effort.  Cardiovascular: Regular rate and rhythm, no murmurs / rubs / gallops. No edema.  Abdomen: no tenderness, no masses palpated. No hepatosplenomegaly.  Musculoskeletal: no clubbing / cyanosis. No joint deformity upper and lower extremities. Good ROM, no contractures. Normal muscle tone.   Skin: no rashes, lesions, ulcers. No induration Neurologic: CN 2-12 grossly intact. Sensation intact, DTR normal. 5/5 strength x4 Psychiatric: Normal judgment and insight. Alert and oriented x 3. Normal mood.     Assessment/Plan  1) Unresponsive episode - presyncope per pt's recollection.  CT head, CXR negative. UA +pyuria, UCx ordered.  No arrhythmia on telemetry. Pt appears weak. +Anemia microcytic.  - start abx for UTI - bolus 2L NS and cont IVFs - echo and EEG results are pending - f/u UCx results - OOB w/ assist, check orthostatic VS  2)  UTI - pyuria on UA - start IV Rocephin - 2 L bolus as above  3) Microcytic anemia - repeat CBC in am after rehydrating - check fe / tibc, consider IV iron if depleted   DVT prophylaxis: Lovenox Code Status: Full Family Communication: Husband at bedside Disposition Plan: Home after admit Consults called: None Admission status: Place in [place]    Rob Holly Velador MD [facility] pager [phone]   02/16/2018, 1:04 PM   Recent Labs  Lab 02/15/18 2315  NA 137  K 3.9  CL 105  CO2 22  GLUCOSE 98  BUN 11  CREATININE 0.53  CALCIUM 8.8*  ALBUMIN 3.8   Recent Labs  Lab 02/15/18 2315  AST 17  ALT 11  ALKPHOS 74  BILITOT 0.5  PROT 7.9   Recent Labs  Lab 02/15/18 2315  WBC  4.4  NEUTROABS 2.1  HGB 9.3*  HCT 30.9*  MCV 60.9*  PLT 307

## 2018-02-17 DIAGNOSIS — E860 Dehydration: Secondary | ICD-10-CM | POA: Diagnosis present

## 2018-02-17 DIAGNOSIS — D509 Iron deficiency anemia, unspecified: Secondary | ICD-10-CM | POA: Diagnosis present

## 2018-02-17 DIAGNOSIS — Z862 Personal history of diseases of the blood and blood-forming organs and certain disorders involving the immune mechanism: Secondary | ICD-10-CM

## 2018-02-17 DIAGNOSIS — R42 Dizziness and giddiness: Secondary | ICD-10-CM

## 2018-02-17 LAB — CBC
HCT: 28.7 % — ABNORMAL LOW (ref 36.0–46.0)
Hemoglobin: 8.2 g/dL — ABNORMAL LOW (ref 12.0–15.0)
MCH: 17.4 pg — ABNORMAL LOW (ref 26.0–34.0)
MCHC: 28.6 g/dL — ABNORMAL LOW (ref 30.0–36.0)
MCV: 61.1 fL — ABNORMAL LOW (ref 80.0–100.0)
NRBC: 0 % (ref 0.0–0.2)
Platelets: 234 10*3/uL (ref 150–400)
RBC: 4.7 MIL/uL (ref 3.87–5.11)
RDW: 15.5 % (ref 11.5–15.5)
WBC: 3.1 10*3/uL — ABNORMAL LOW (ref 4.0–10.5)

## 2018-02-17 LAB — URINE CULTURE

## 2018-02-17 LAB — FERRITIN: Ferritin: 16 ng/mL (ref 11–307)

## 2018-02-17 LAB — BASIC METABOLIC PANEL
Anion gap: 9 (ref 5–15)
BUN: 8 mg/dL (ref 6–20)
CO2: 20 mmol/L — ABNORMAL LOW (ref 22–32)
Calcium: 7.9 mg/dL — ABNORMAL LOW (ref 8.9–10.3)
Chloride: 109 mmol/L (ref 98–111)
Creatinine, Ser: 0.63 mg/dL (ref 0.44–1.00)
Glucose, Bld: 80 mg/dL (ref 70–99)
Potassium: 3.6 mmol/L (ref 3.5–5.1)
SODIUM: 138 mmol/L (ref 135–145)

## 2018-02-17 LAB — URINE DRUGS OF ABUSE SCREEN W ALC, ROUTINE (REF LAB)
AMPHETAMINES, URINE: NEGATIVE ng/mL
BENZODIAZEPINE QUANT UR: NEGATIVE ng/mL
Barbiturate, Ur: NEGATIVE ng/mL
Cannabinoid Quant, Ur: NEGATIVE ng/mL
Cocaine (Metab.): NEGATIVE ng/mL
Ethanol U, Quan: NEGATIVE %
METHADONE SCREEN, URINE: NEGATIVE ng/mL
Opiate Quant, Ur: NEGATIVE ng/mL
PHENCYCLIDINE, UR: NEGATIVE ng/mL
PROPOXYPHENE, URINE: NEGATIVE ng/mL

## 2018-02-17 LAB — GLUCOSE, CAPILLARY: Glucose-Capillary: 74 mg/dL (ref 70–99)

## 2018-02-17 LAB — VITAMIN B12: Vitamin B-12: 413 pg/mL (ref 180–914)

## 2018-02-17 NOTE — Plan of Care (Signed)
  Problem: Activity: Goal: Risk for activity intolerance will decrease Outcome: Progressing   Problem: Coping: Goal: Level of anxiety will decrease Outcome: Progressing   Problem: Safety: Goal: Ability to remain free from injury will improve Outcome: Progressing   

## 2018-02-17 NOTE — Discharge Summary (Addendum)
Physician Discharge Summary  Patient ID: Holly Newton MRN: [id number] DOB/AGE: 46/02/1970 46 y.o.  Admit date: 02/15/2018 Discharge date: 02/17/2018  Admission Diagnoses:  Unresponsive episode  Presyncope  Altered mental status   Discharge Diagnoses:   Unresponsive episode  Presyncope  Dehydration  Anemia , microcytic  Discharged Condition: good  Hospital Course: *Holly Newton a [age] y.o.femalewith medical history significant ofpreviously healthy. Patient presents to ED with AMS. Minimally responsive, withdrawing to pain initially. Breathing on own and has pulse. CBG 90, BP 120s systolic, O2 sat 13%97% on RA. Not speaking and not moving spontaneously initially. ED Course:CT head neg. VBG obtained and doesn't appear to be retaining CO2. UDS pending. Patient has since woken up, now awake and answering questions.  Of note, patient was upset when her children left the house today.  Assessment/Plan  1) Unresponsive episode - presyncope per pt's recollection.  CT head, CXR negative.  No arrhythmia on telemetry. ECHO and EEG were normal.  Pt felt much better after 2 L IVF's and was up walking the next day w/o difficulty. Suspect dehydration, drinks many caffeinated drinks per day (teas and coffees), we discussed cutting back on caffeine.  Also pt is anemic and has history of anemia "since childhoold".  Needs w/u for this ( microcytic w/ normal iron studies and normal menses per pt). ? thalassemia.  Hb 8.5 and pt asymptomatic today after rehydration.  Will refer her to Hematology in OP setting.   2)  UTI - pyuria on UA but no symptoms or fevers, will not treat  3) Microcytic anemia - as above, OP referral to Hematology d/ w pt and husband, they will proceed.    Discharge Exam: Blood pressure 113/61, pulse 79, temperature 98.1 F (36.7 C), temperature source Oral, resp. rate 16, height 5\' 4"  (1.626 m), weight 90 kg, SpO2 100 %. Constitutional:NAD, calm,  comfortable Neck:normal, supple, no masses, no thyromegaly Respiratory:clear to auscultation bilaterally, no wheezing, no crackles. Normal respiratory effort.  Cardiovascular:Regular rate and rhythm, no murmurs / rubs / gallops. No edema.  Abdomen:no tenderness, no masses palpated. No hepatosplenomegaly.  Musculoskeletal:no clubbing / cyanosis. No joint deformity upper and lower extremities. Good ROM, no contractures. Normal muscle tone.  Skin:no rashes, lesions, ulcers. No induration Neurologic:CN 2-12 grossly intact. Sensation intact, DTR normal. 5/5 strength x4 Psychiatric:Normal judgment and insight. Alert and oriented x 3. Normal mood.  Disposition: Discharge disposition: 01-Home or Self Care        Allergies as of 02/17/2018   No Known Allergies     Medication List    STOP taking these medications   meclizine 25 MG tablet Commonly known as:  ANTIVERT   ondansetron 8 MG disintegrating tablet Commonly known as:  ZOFRAN-ODT     TAKE these medications   albuterol 108 (90 Base) MCG/ACT inhaler Commonly known as:  PROVENTIL HFA;VENTOLIN HFA Inhale 1-2 puffs into the lungs every 6 (six) hours as needed for wheezing or shortness of breath.        Signed: Barbette HairRobert D Holly Newton 02/17/2018, 10:29 AM

## 2018-02-19 LAB — FOLATE RBC
Folate, Hemolysate: 305.7 ng/mL
Folate, RBC: 1006 ng/mL (ref >498)
Hematocrit: 30.4 % — ABNORMAL LOW (ref 34.0–46.6)

## 2018-02-20 LAB — CULTURE, BLOOD (ROUTINE X 2)
CULTURE: NO GROWTH
Special Requests: ADEQUATE

## 2018-02-21 LAB — CULTURE, BLOOD (ROUTINE X 2)
Culture: NO GROWTH
Special Requests: ADEQUATE

## 2018-02-23 ENCOUNTER — Telehealth: Payer: Self-pay | Admitting: Internal Medicine

## 2018-02-23 NOTE — Telephone Encounter (Signed)
Tried to reach regarding voicemail °

## 2018-02-27 ENCOUNTER — Telehealth: Payer: Self-pay | Admitting: Adult Health

## 2018-02-27 NOTE — Telephone Encounter (Signed)
A new hem appt has been scheduled for the pt to see Lillard Anes on 1/8 at 830am. Aware to arrive 30 minutes early. Pt was seen in the hospital for anemia. Notes and labs are in the pt's chart.  Pt was originally referred by [place] primary care, but no showed for appt in May of 2019.

## 2018-03-01 ENCOUNTER — Encounter: Payer: Self-pay | Admitting: Adult Health

## 2018-03-01 ENCOUNTER — Inpatient Hospital Stay: Payer: BLUE CROSS/BLUE SHIELD | Attending: Adult Health | Admitting: Adult Health

## 2018-03-01 ENCOUNTER — Inpatient Hospital Stay: Payer: BLUE CROSS/BLUE SHIELD

## 2018-03-01 ENCOUNTER — Telehealth: Payer: Self-pay | Admitting: Hematology and Oncology

## 2018-03-01 VITALS — BP 130/78 | HR 75 | Temp 97.6°F | Resp 14 | Ht 64.0 in | Wt 196.3 lb

## 2018-03-01 DIAGNOSIS — E669 Obesity, unspecified: Secondary | ICD-10-CM | POA: Diagnosis not present

## 2018-03-01 DIAGNOSIS — D509 Iron deficiency anemia, unspecified: Secondary | ICD-10-CM

## 2018-03-01 DIAGNOSIS — Z8249 Family history of ischemic heart disease and other diseases of the circulatory system: Secondary | ICD-10-CM | POA: Insufficient documentation

## 2018-03-01 DIAGNOSIS — Z79899 Other long term (current) drug therapy: Secondary | ICD-10-CM | POA: Diagnosis not present

## 2018-03-01 DIAGNOSIS — D563 Thalassemia minor: Secondary | ICD-10-CM | POA: Diagnosis not present

## 2018-03-01 LAB — RETICULOCYTES
Immature Retic Fract: 14.3 % (ref 2.3–15.9)
RBC.: 5.22 MIL/uL — ABNORMAL HIGH (ref 3.87–5.11)
RETIC CT PCT: 1.2 % (ref 0.4–3.1)
Retic Count, Absolute: 63.7 10*3/uL (ref 19.0–186.0)

## 2018-03-01 LAB — DIRECT ANTIGLOBULIN TEST (NOT AT ARMC)
DAT, IgG: NEGATIVE
DAT, complement: NEGATIVE

## 2018-03-01 LAB — CBC WITH DIFFERENTIAL (CANCER CENTER ONLY)
Abs Immature Granulocytes: 0.01 10*3/uL (ref 0.00–0.07)
Basophils Absolute: 0 10*3/uL (ref 0.0–0.1)
Basophils Relative: 0 %
Eosinophils Absolute: 0.1 10*3/uL (ref 0.0–0.5)
Eosinophils Relative: 2 %
HCT: 31.9 % — ABNORMAL LOW (ref 36.0–46.0)
Hemoglobin: 9.5 g/dL — ABNORMAL LOW (ref 12.0–15.0)
Immature Granulocytes: 0 %
LYMPHS PCT: 47 %
Lymphs Abs: 1.7 10*3/uL (ref 0.7–4.0)
MCH: 18.2 pg — ABNORMAL LOW (ref 26.0–34.0)
MCHC: 29.8 g/dL — ABNORMAL LOW (ref 30.0–36.0)
MCV: 61.1 fL — AB (ref 80.0–100.0)
Monocytes Absolute: 0.2 10*3/uL (ref 0.1–1.0)
Monocytes Relative: 6 %
Neutro Abs: 1.7 10*3/uL (ref 1.7–7.7)
Neutrophils Relative %: 45 %
Platelet Count: 340 10*3/uL (ref 150–400)
RBC: 5.22 MIL/uL — ABNORMAL HIGH (ref 3.87–5.11)
RDW: 15.8 % — ABNORMAL HIGH (ref 11.5–15.5)
WBC Count: 3.8 10*3/uL — ABNORMAL LOW (ref 4.0–10.5)
nRBC: 0 % (ref 0.0–0.2)

## 2018-03-01 LAB — C-REACTIVE PROTEIN: CRP: <0.8 mg/dL (ref <1.0)

## 2018-03-01 LAB — LACTATE DEHYDROGENASE: LDH: 158 U/L (ref 98–192)

## 2018-03-01 LAB — TSH: TSH: 2.209 u[IU]/mL (ref 0.308–3.960)

## 2018-03-01 NOTE — Assessment & Plan Note (Signed)
Holly Newton is a 48 year old [place] woman with anemia.  She met with Dr. Lindi Adie today to review her initial labs, her history, and determine what steps need to be taken to complete her anemia work up.  Please see below for lab tests ordered. She will return in one week to meet with Dr. Lindi Adie to review results.    She did ask me about her weight and if she should lose weight.  I noted that her BMI is in the overweight to obese category and she should look at dietary modifications and exercise.

## 2018-03-01 NOTE — Progress Notes (Addendum)
[facility] Initial Visit:  Patient Care Team: System, Pcp Not In as PCP - General  CHIEF COMPLAINTS/PURPOSE OF CONSULTATION:  Microcytic anemia  HISTORY OF PRESENTING ILLNESS: Holly Newton 48 y.o. female is here because of  a recent hospitalization from 02/15/2018 through 02/17/2018 that noted anemia with her last hemoglobin of 8.2.  She notes that she has been anemic for as long as she can remember.  She cannot identify of FH of anemia, other than her daughter.  She is still menstruating every month for 5 days at a time, with normal flow.  She denies any blood in her stool or black tarry stool.  She does not eat red meat, however notes she eats increased vegetables.    Review of Systems  Constitutional: Positive for fatigue (occasional when working around the house such as sweeping). Negative for appetite change, chills and unexpected weight change.  HENT:   Negative for hearing loss, lump/mass, sore throat and trouble swallowing.   Eyes: Negative for eye problems and icterus.  Respiratory: Negative for chest tightness, cough and shortness of breath.   Cardiovascular: Negative for chest pain, leg swelling and palpitations.  Gastrointestinal: Negative for abdominal distention, abdominal pain, constipation, diarrhea, nausea and vomiting.  Endocrine: Negative for hot flashes.  Genitourinary: Negative for difficulty urinating, hematuria, menstrual problem, pelvic pain and vaginal discharge.   Musculoskeletal: Negative for arthralgias.  Skin: Negative for itching and rash.  Neurological: Negative for dizziness, extremity weakness, headaches and numbness.  Hematological: Negative for adenopathy. Does not bruise/bleed easily.  Psychiatric/Behavioral: Negative for depression. The patient is not nervous/anxious.     MEDICAL HISTORY: Past Medical History:  Diagnosis Date  . Anemia   . Anemia   . Asthma   . Bleeding hemorrhoid     SURGICAL HISTORY: Past Surgical  History:  Procedure Laterality Date  . EYE SURGERY  1977    SOCIAL HISTORY: Social History   Socioeconomic History  . Marital status: Married    Spouse name: Not on file  . Number of children: Not on file  . Years of education: Not on file  . Highest education level: Not on file  Occupational History  . Not on file  Social Needs  . Financial resource strain: Not very hard  . Food insecurity:    Worry: Not on file    Inability: Not on file  . Transportation needs:    Medical: No    Non-medical: No  Tobacco Use  . Smoking status: Never Smoker  . Smokeless tobacco: Never Used  Substance and Sexual Activity  . Alcohol use: No  . Drug use: No  . Sexual activity: Yes  Lifestyle  . Physical activity:    Days per week: Not on file    Minutes per session: Not on file  . Stress: Not on file  Relationships  . Social connections:    Talks on phone: Not on file    Gets together: Not on file    Attends religious service: Not on file    Active member of club or organization: Not on file    Attends meetings of clubs or organizations: Not on file    Relationship status: Not on file  . Intimate partner violence:    Fear of current or ex partner: Not on file    Emotionally abused: Not on file    Physically abused: Not on file    Forced sexual activity: Not on file  Other Topics Concern  .  Not on file  Social History Narrative   Eula is a [place] woman, she moved to the [place] in 2002.  Her primary language is Arabic.  She has 5 children, they are from her first marriage.  She has been divorced and is remarried.  She lives with her husband in [place], [place], and previously worked in Production designer, theatre/television/film, however she has been on a leave of absence since her dad suffered a heart attack in 09/2017 so that she can help him.      FAMILY HISTORY Family History  Problem Relation Age of Onset  . Heart attack Father   . Anemia Daughter     ALLERGIES:  has No Known  Allergies.  MEDICATIONS:  Current Outpatient Medications  Medication Sig Dispense Refill  . albuterol (PROVENTIL HFA;VENTOLIN HFA) 108 (90 Base) MCG/ACT inhaler Inhale 1-2 puffs into the lungs every 6 (six) hours as needed for wheezing or shortness of breath. 1 Inhaler 0  . Cyanocobalamin (VITAMIN B-12 PO) Take by mouth daily.    Marland Kitchen VITAMIN D PO Take by mouth daily.     No current facility-administered medications for this visit.     PHYSICAL EXAMINATION:  ECOG PERFORMANCE STATUS: 1 - Symptomatic but completely ambulatory   Vitals:   03/01/18 0810  BP: 130/78  Pulse: 75  Resp: 14  Temp: 97.6 F (36.4 C)  SpO2: 100%    Filed Weights   03/01/18 0810  Weight: 196 lb 4.8 oz (89 kg)     Physical Exam Constitutional:      Appearance: Normal appearance.  HENT:     Head: Normocephalic and atraumatic.     Mouth/Throat:     Mouth: Mucous membranes are moist.     Pharynx: Oropharynx is clear.  Eyes:     General: No scleral icterus.    Pupils: Pupils are equal, round, and reactive to light.  Neck:     Musculoskeletal: Neck supple.  Cardiovascular:     Rate and Rhythm: Normal rate and regular rhythm.     Heart sounds: Normal heart sounds.  Pulmonary:     Effort: Pulmonary effort is normal.     Breath sounds: Normal breath sounds.  Abdominal:     General: Abdomen is flat. Bowel sounds are normal. There is no distension.     Palpations: Abdomen is soft.     Tenderness: There is no abdominal tenderness.     Comments: No organomegaly noted   Lymphadenopathy:     Cervical: No cervical adenopathy.  Skin:    General: Skin is warm and dry.     Capillary Refill: Capillary refill takes less than 2 seconds.     Findings: No rash.  Neurological:     General: No focal deficit present.     Mental Status: She is alert.  Psychiatric:        Mood and Affect: Mood normal.        Behavior: Behavior normal.      LABORATORY DATA: I have personally reviewed the data as  listed:  Admission on 02/15/2018, Discharged on 02/17/2018  Component Date Value Ref Range Status  . Lactic Acid, Venous 02/15/2018 0.56  0.5 - 1.9 mmol/L Final  . Sodium 02/15/2018 137  135 - 145 mmol/L Final  . Potassium 02/15/2018 3.9  3.5 - 5.1 mmol/L Final  . Chloride 02/15/2018 105  98 - 111 mmol/L Final  . CO2 02/15/2018 22  22 - 32 mmol/L Final  . Glucose, Bld 02/15/2018 98  70 - 99 mg/dL Final  . BUN 02/15/2018 11  6 - 20 mg/dL Final  . Creatinine, Ser 02/15/2018 0.53  0.44 - 1.00 mg/dL Final  . Calcium 02/15/2018 8.8* 8.9 - 10.3 mg/dL Final  . Total Protein 02/15/2018 7.9  6.5 - 8.1 g/dL Final  . Albumin 02/15/2018 3.8  3.5 - 5.0 g/dL Final  . AST 02/15/2018 17  15 - 41 U/L Final  . ALT 02/15/2018 11  0 - 44 U/L Final  . Alkaline Phosphatase 02/15/2018 74  38 - 126 U/L Final  . Total Bilirubin 02/15/2018 0.5  0.3 - 1.2 mg/dL Final  . GFR calc non Af Amer 02/15/2018 >60  >60 mL/min Final  . GFR calc Af Amer 02/15/2018 >60  >60 mL/min Final  . Anion gap 02/15/2018 10  5 - 15 Final   Performed at [facility] [address] [place], [place] [address]  . WBC 02/15/2018 4.4  4.0 - 10.5 K/uL Final  . RBC 02/15/2018 5.07  3.87 - 5.11 MIL/uL Final  . Hemoglobin 02/15/2018 9.3* 12.0 - 15.0 g/dL Final  . HCT 02/15/2018 30.9* 36.0 - 46.0 % Final  . MCV 02/15/2018 60.9* 80.0 - 100.0 fL Final  . MCH 02/15/2018 18.3* 26.0 - 34.0 pg Final  . MCHC 02/15/2018 30.1  30.0 - 36.0 g/dL Final  . RDW 02/15/2018 15.6* 11.5 - 15.5 % Final  . Platelets 02/15/2018 307  150 - 400 K/uL Final  . nRBC 02/15/2018 0.0  0.0 - 0.2 % Final  . Neutrophils Relative % 02/15/2018 47  % Final  . Neutro Abs 02/15/2018 2.1  1.7 - 7.7 K/uL Final  . Lymphocytes Relative 02/15/2018 42  % Final  . Lymphs Abs 02/15/2018 1.9  0.7 - 4.0 K/uL Final  . Monocytes Relative 02/15/2018 9  % Final  . Monocytes Absolute 02/15/2018 0.4  0.1 - 1.0 K/uL Final  . Eosinophils Relative 02/15/2018 1  % Final  . Eosinophils  Absolute 02/15/2018 0.1  0.0 - 0.5 K/uL Final  . Basophils Relative 02/15/2018 1  % Final  . Basophils Absolute 02/15/2018 0.0  0.0 - 0.1 K/uL Final  . Immature Granulocytes 02/15/2018 0  % Final  . Abs Immature Granulocytes 02/15/2018 0.01  0.00 - 0.07 K/uL Final   Performed at [facility], [place] [address], [place], [place] [address]  . Specimen Description 02/16/2018 BLOOD LEFT HAND   Final  . Special Requests 02/16/2018 BOTTLES DRAWN AEROBIC ONLY Blood Culture adequate volume   Final  . Culture 02/16/2018    Final                   Value:NO GROWTH 5 DAYS Performed at [facility], [place] [address] [place], [place] [address]   . Report Status 02/16/2018 02/21/2018 FINAL   Final  . Specimen Description 02/15/2018 BLOOD REJ   Final  . Special Requests 02/15/2018 BOTTLES DRAWN AEROBIC AND ANAEROBIC Blood Culture adequate volume   Final  . Culture 02/15/2018    Final                   Value:NO GROWTH 5 DAYS Performed at [facility], [place] [address], [place],  [address]   . Report Status 02/15/2018 02/20/2018 FINAL   Final  . Color, Urine 02/16/2018 STRAW* YELLOW Final  . APPearance 02/16/2018 HAZY* CLEAR Final  . Specific Gravity, Urine 02/16/2018 1.008  1.005 - 1.030 Final  . pH 02/16/2018  6.0  5.0 - 8.0 Final  . Glucose, UA 02/16/2018 NEGATIVE  NEGATIVE mg/dL Final  . Hgb urine dipstick 02/16/2018 SMALL* NEGATIVE Final  . Bilirubin Urine 02/16/2018 NEGATIVE  NEGATIVE Final  . Ketones, ur 02/16/2018 NEGATIVE  NEGATIVE mg/dL Final  . Protein, ur 02/16/2018 NEGATIVE  NEGATIVE mg/dL Final  . Nitrite 02/16/2018 NEGATIVE  NEGATIVE Final  . Leukocytes, UA 02/16/2018 LARGE* NEGATIVE Final  . RBC / HPF 02/16/2018 0-5  0 - 5 RBC/hpf Final  . WBC, UA 02/16/2018 21-50  0 - 5 WBC/hpf Final  . Bacteria, UA 02/16/2018 FEW* NONE SEEN Final  . Squamous Epithelial / LPF 02/16/2018 0-5  0 - 5 Final  . Mucus 02/16/2018 PRESENT   Final  . Trichomonas, UA 02/16/2018 PRESENT*  NONE SEEN Final   Performed at [facility], [place] [address] [place], [place] [address]  . Troponin i, poc 02/15/2018 0.00  0.00 - 0.08 ng/mL Final  . Comment 3 02/15/2018          Final   Comment: Due to the release kinetics of cTnI, a negative result within the first hours of the onset of symptoms does not rule out myocardial infarction with certainty. If myocardial infarction is still suspected, repeat the test at appropriate intervals.   . Influenza A By PCR 02/15/2018 NEGATIVE  NEGATIVE Final  . Influenza B By PCR 02/15/2018 NEGATIVE  NEGATIVE Final   Comment: (NOTE) The Xpert Xpress Flu assay is intended as an aid in the diagnosis of  influenza and should not be used as a sole basis for treatment.  This  assay is FDA approved for nasopharyngeal swab specimens only. Nasal  washings and aspirates are unacceptable for Xpert Xpress Flu testing. Performed at [facility], [place] [address], [place], [place] [address]   . I-stat hCG, quantitative 02/15/2018 <5.0  <5 mIU/mL Final  . Comment 3 02/15/2018          Final   Comment:   GEST. AGE      CONC.  (mIU/mL)   <=1 WEEK        5 - 50     2 WEEKS       50 - 500     3 WEEKS       100 - 10,000     4 WEEKS     1,000 - 30,000        FEMALE AND NON-PREGNANT FEMALE:     LESS THAN 5 mIU/mL   . Specimen Description 02/15/2018 URINE, CATHETERIZED   Final  . Special Requests 02/15/2018 NONE   Final  . Culture 02/15/2018 *  Final                   Value:<10,000 COLONIES/mL INSIGNIFICANT GROWTH Performed at [facility] [address] [place], [place] [address]   . Report Status 02/15/2018 02/17/2018 FINAL   Final  . Ammonia 02/15/2018 18  9 - 35 umol/L Final   Performed at [facility], [place] [address] [place], [place] [address]  . Alcohol, Ethyl (B) 02/15/2018 <10  <10 mg/dL Final   Comment: (NOTE) Lowest detectable limit for serum alcohol is 10 mg/dL. For medical purposes only. Performed at [facility], [place] [address], [place], [place] [address]   . Amphetamines, Urine 02/16/2018 Negative  Cutoff=1000 ng/mL Final   Amphetamine test includes Amphetamine and Methamphetamine.  . Barbiturate, Ur 02/16/2018 Negative  Cutoff=300 ng/mL Final  .  Benzodiazepine Quant, Ur 02/16/2018 Negative  Cutoff=300 ng/mL Final  . Cannabinoid Quant, Ur 02/16/2018 Negative  Cutoff=50 ng/mL Final  . Cocaine (Metab.) 02/16/2018 Negative  Cutoff=300 ng/mL Final  . Opiate Quant, Ur 02/16/2018 Negative  Cutoff=300 ng/mL Final   Opiate test includes Codeine and Morphine only.  . Phencyclidine, Ur 02/16/2018 Negative  Cutoff=25 ng/mL Final  . Methadone Screen, Urine 02/16/2018 Negative  Cutoff=300 ng/mL Final  . Propoxyphene, Urine 02/16/2018 Negative  Cutoff=300 ng/mL Final  . Ethanol U, Quan 02/16/2018 Negative  Cutoff=0.020 % Final   Comment: (NOTE) Performed At: [facility] [address] [place], [place] [id number] Avis Epley PhD [id number]   . Opiates 02/16/2018 NONE DETECTED  NONE DETECTED Final  . Cocaine 02/16/2018 NONE DETECTED  NONE DETECTED Final  . Benzodiazepines 02/16/2018 NONE DETECTED  NONE DETECTED Final  . Amphetamines 02/16/2018 NONE DETECTED  NONE DETECTED Final  . Tetrahydrocannabinol 02/16/2018 NONE DETECTED  NONE DETECTED Final  . Barbiturates 02/16/2018 NONE DETECTED  NONE DETECTED Final   Comment: (NOTE) DRUG SCREEN FOR MEDICAL PURPOSES ONLY.  IF CONFIRMATION IS NEEDED FOR ANY PURPOSE, NOTIFY LAB WITHIN 5 DAYS. LOWEST DETECTABLE LIMITS FOR URINE DRUG SCREEN Drug Class                     Cutoff (ng/mL) Amphetamine and metabolites    1000 Barbiturate and metabolites    200 Benzodiazepine                 157 Tricyclics and metabolites     300 Opiates and metabolites        300 Cocaine and metabolites        300 THC                            50 Performed at [facility], [place] [address] [place], [place] [address]   . pH, Ven 02/15/2018 7.394  7.250 - 7.430 Final   . pCO2, Ven 02/15/2018 39.6* 44.0 - 60.0 mmHg Final  . pO2, Ven 02/15/2018 35.0  32.0 - 45.0 mmHg Final  . Bicarbonate 02/15/2018 24.2  20.0 - 28.0 mmol/L Final  . TCO2 02/15/2018 25  22 - 32 mmol/L Final  . O2 Saturation 02/15/2018 67.0  % Final  . Acid-base deficit 02/15/2018 1.0  0.0 - 2.0 mmol/L Final  . Patient temperature 02/15/2018 HIDE   Final  . Collection site 02/15/2018 BRACHIAL ARTERY   Final  . Sample type 02/15/2018 VENOUS   Final  . Comment 02/15/2018 NOTIFIED PHYSICIAN   Final  . Glucose-Capillary 02/15/2018 90  70 - 99 mg/dL Final  . HIV Screen 4th Generation wRfx 02/16/2018 Non Reactive  Non Reactive Final   Comment: (NOTE) Performed At: [facility] [address] [place], [place] [id number] Rush Farmer MD [id number]   . Weight 02/16/2018 3,054.4  oz Final  . Height 02/16/2018 64  in Final  . BP 02/16/2018 117/76  mmHg Final  . Glucose-Capillary 02/16/2018 77  70 - 99 mg/dL Final  . Iron 02/16/2018 79  28 - 170 ug/dL Final  . TIBC 02/16/2018 279  250 - 450 ug/dL Final  . Saturation Ratios 02/16/2018 28  10.4 - 31.8 % Final  . UIBC 02/16/2018 200  ug/dL Final   Performed at [facility], [place] [address], [place], [place] [address]  . WBC 02/17/2018 3.1* 4.0 - 10.5 K/uL Final  . RBC 02/17/2018 4.70  3.87 - 5.11 MIL/uL Final  . Hemoglobin 02/17/2018 8.2* 12.0 - 15.0 g/dL Final   Comment: Reticulocyte Hemoglobin testing may be clinically indicated, consider ordering this additional test [id number]   . HCT 02/17/2018 28.7* 36.0 - 46.0 % Final  . MCV 02/17/2018 61.1* 80.0 - 100.0 fL Final  . MCH 02/17/2018 17.4* 26.0 - 34.0 pg Final  . MCHC 02/17/2018 28.6* 30.0 - 36.0 g/dL Final  . RDW 02/17/2018 15.5  11.5 - 15.5 % Final  . Platelets 02/17/2018 234  150 - 400 K/uL Final   REPEATED TO VERIFY  . nRBC 02/17/2018 0.0  0.0 - 0.2 % Final   Performed at [facility], [place] [address] [place], [place] [address]  . Sodium 02/17/2018 138  135 -  145 mmol/L Final  . Potassium 02/17/2018 3.6  3.5 - 5.1 mmol/L Final  . Chloride 02/17/2018 109  98 - 111 mmol/L Final  . CO2 02/17/2018 20* 22 - 32 mmol/L Final  . Glucose, Bld 02/17/2018 80  70 - 99 mg/dL Final  . BUN 02/17/2018 8  6 - 20 mg/dL Final  . Creatinine, Ser 02/17/2018 0.63  0.44 - 1.00 mg/dL Final  . Calcium 02/17/2018 7.9* 8.9 - 10.3 mg/dL Final  . GFR calc non Af Amer 02/17/2018 >60  >60 mL/min Final  . GFR calc Af Amer 02/17/2018 >60  >60 mL/min Final  . Anion gap 02/17/2018 9  5 - 15 Final   Performed at [facility], [place] [address], [place], [place] [address]  . Ferritin 02/17/2018 16  11 - 307 ng/mL Final   Performed at [facility], [place] [address], [place], [place] [address]  . Vitamin B-12 02/17/2018 413  180 - 914 pg/mL Final   Comment: (NOTE) This assay is not validated for testing neonatal or myeloproliferative syndrome specimens for Vitamin B12 levels. Performed at [facility], [place] [address] [place], [place] [address]   . Folate, Hemolysate 02/17/2018 305.7  Not Estab. ng/mL Final  . Hematocrit 02/17/2018 30.4* 34.0 - 46.6 % Final  . Folate, RBC 02/17/2018 1,006  >498 ng/mL Final   Comment: (NOTE) Performed At: [facility] [place], [place] [id number] Rush Farmer MD [id number]   . Glucose-Capillary 02/17/2018 74  70 - 99 mg/dL Final    RADIOGRAPHIC STUDIES:  ASSESSMENT/PLAN    Microcytic anemia Rayshawn is a 47 year old [place] woman with anemia.  She met with Dr. Lindi Adie today to review her initial labs, her history, and determine what steps need to be taken to complete her anemia work up.  Please see below for lab tests ordered. She will return in one week to meet with Dr. Lindi Adie to review results.    She did ask me about her weight and if she should lose weight.  I noted that her BMI is in the overweight to obese category and she should look at dietary modifications and exercise.      Orders  Placed This Encounter  Procedures  . CBC with Differential (Cancer Center Only)    Standing Status:   Future    Number of Occurrences:   1    Standing Expiration Date:   03/02/2019  . Lactate dehydrogenase (LDH)    Standing Status:   Future    Number of Occurrences:   1    Standing Expiration Date:   03/01/2019  . Haptoglobin    Standing Status:   Future  Number of Occurrences:   1    Standing Expiration Date:   03/01/2019  . Reticulocytes    Standing Status:   Future    Number of Occurrences:   1    Standing Expiration Date:   03/02/2019  . SPEP (Serum protein electrophoresis)    Standing Status:   Future    Number of Occurrences:   1    Standing Expiration Date:   03/01/2019  . TSH    Standing Status:   Future    Number of Occurrences:   1    Standing Expiration Date:   03/01/2019  . C-reactive protein    Standing Status:   Future    Number of Occurrences:   1    Standing Expiration Date:   03/01/2019  . Hemoglobinopathy evaluation    Standing Status:   Future    Number of Occurrences:   1    Standing Expiration Date:   03/01/2019  . Direct antiglobulin test ([facility])    Standing Status:   Future    Number of Occurrences:   1    Standing Expiration Date:   03/01/2019    All questions were answered. The patient knows to call the clinic with any problems, questions or concerns.  This note was electronically signed.    Scot Dock, NP  03/01/2018 9:12 AM   Attending Note  I personally saw and examined Maud Deed. The plan of care was discussed with her. I agree with the assessment and plan as documented above. I recommended hemoglobin electrophoresis, serum protein electrophoresis, hemolysis work-up, TSH reticulocyte count and follow-up in 1 week. Signed Harriette Ohara, MD

## 2018-03-01 NOTE — Telephone Encounter (Signed)
Gave avs and calendar ° °

## 2018-03-02 LAB — HAPTOGLOBIN: Haptoglobin: 119 mg/dL (ref 42–296)

## 2018-03-02 LAB — PROTEIN ELECTROPHORESIS, SERUM
A/G Ratio: 1.1 (ref 0.7–1.7)
Albumin ELP: 3.7 g/dL (ref 2.9–4.4)
Alpha-1-Globulin: 0.2 g/dL (ref 0.0–0.4)
Alpha-2-Globulin: 0.6 g/dL (ref 0.4–1.0)
Beta Globulin: 1 g/dL (ref 0.7–1.3)
Gamma Globulin: 1.6 g/dL (ref 0.4–1.8)
Globulin, Total: 3.5 g/dL (ref 2.2–3.9)
Total Protein ELP: 7.2 g/dL (ref 6.0–8.5)

## 2018-03-02 LAB — HEMOGLOBINOPATHY EVALUATION
HGB A2 QUANT: 4.7 % — AB (ref 1.8–3.2)
HGB A: 95.3 % — AB (ref 96.4–98.8)
Hgb C: 0 %
Hgb F Quant: 0 % (ref 0.0–2.0)
Hgb S Quant: 0 %
Hgb Variant: 0 %

## 2018-03-06 ENCOUNTER — Telehealth: Payer: Self-pay | Admitting: Hematology and Oncology

## 2018-03-06 ENCOUNTER — Inpatient Hospital Stay (HOSPITAL_BASED_OUTPATIENT_CLINIC_OR_DEPARTMENT_OTHER): Payer: BLUE CROSS/BLUE SHIELD | Admitting: Hematology and Oncology

## 2018-03-06 DIAGNOSIS — D509 Iron deficiency anemia, unspecified: Secondary | ICD-10-CM

## 2018-03-06 DIAGNOSIS — D563 Thalassemia minor: Secondary | ICD-10-CM

## 2018-03-06 MED ORDER — FERROUS GLUCONATE 324 (38 FE) MG PO TABS: 324.0000 mg | ORAL_TABLET | Freq: Every day | ORAL | 3 refills | Status: DC

## 2018-03-06 NOTE — Progress Notes (Signed)
Patient Care Team: System, Pcp Not In as PCP - General  DIAGNOSIS:  Encounter Diagnosis  Name Primary?  . Microcytic anemia     CHIEF COMPLIANT: Follow-up to review blood work from recent studies  INTERVAL HISTORY: Holly Newton is a 48 year old with above-mentioned history of microcytic anemia who is referred to me for evaluation.  She had a disproportionate microcytosis compared to her hemoglobin levels and that therefore we performed hemoglobin electrophoresis.  We also performed normocytic anemia work-up and and she is here to discuss those results as well.  REVIEW OF SYSTEMS:   Constitutional: Denies fevers, chills or abnormal weight loss Eyes: Denies blurriness of vision Ears, nose, mouth, throat, and face: Denies mucositis or sore throat Respiratory: Denies cough, dyspnea or wheezes Cardiovascular: Denies palpitation, chest discomfort Gastrointestinal:  Denies nausea, heartburn or change in bowel habits Skin: Denies abnormal skin rashes Lymphatics: Denies new lymphadenopathy or easy bruising Neurological:Denies numbness, tingling or new weaknesses Behavioral/Psych: Mood is stable, no new changes  Extremities: No lower extremity edema   All other systems were reviewed with the patient and are negative.  I have reviewed the past medical history, past surgical history, social history and family history with the patient and they are unchanged from previous note.  ALLERGIES:  has No Known Allergies.  MEDICATIONS:  Current Outpatient Medications  Medication Sig Dispense Refill  . albuterol (PROVENTIL HFA;VENTOLIN HFA) 108 (90 Base) MCG/ACT inhaler Inhale 1-2 puffs into the lungs every 6 (six) hours as needed for wheezing or shortness of breath. 1 Inhaler 0  . Cyanocobalamin (VITAMIN B-12 PO) Take by mouth daily.    . ferrous gluconate (FERGON) 324 MG tablet Take 1 tablet (324 mg total) by mouth daily with breakfast. 90 tablet 3  . VITAMIN D PO Take by mouth daily.      No current facility-administered medications for this visit.     PHYSICAL EXAMINATION: ECOG PERFORMANCE STATUS: 1 - Symptomatic but completely ambulatory  Vitals:   03/06/18 1444  BP: 125/79  Pulse: 95  Resp: 17  Temp: 98.3 F (36.8 C)  SpO2: 100%   Filed Weights   03/06/18 1444  Weight: 196 lb 6.4 oz (89.1 kg)    GENERAL:alert, no distress and comfortable SKIN: skin color, texture, turgor are normal, no rashes or significant lesions EYES: normal, Conjunctiva are pink and non-injected, sclera clear OROPHARYNX:no exudate, no erythema and lips, buccal mucosa, and tongue normal  NECK: supple, thyroid normal size, non-tender, without nodularity LYMPH:  no palpable lymphadenopathy in the cervical, axillary or inguinal LUNGS: clear to auscultation and percussion with normal breathing effort HEART: regular rate & rhythm and no murmurs and no lower extremity edema ABDOMEN:abdomen soft, non-tender and normal bowel sounds MUSCULOSKELETAL:no cyanosis of digits and no clubbing  NEURO: alert & oriented x 3 with fluent speech, no focal motor/sensory deficits EXTREMITIES: No lower extremity edema   LABORATORY DATA:  I have reviewed the data as listed CMP Latest Ref Rng & Units 02/17/2018 02/15/2018 07/04/2017  Glucose 70 - 99 mg/dL 80 98 97  BUN 6 - 20 mg/dL 8 11 14   Creatinine 0.44 - 1.00 mg/dL 1.910.63 4.780.53 2.950.72  Sodium 135 - 145 mmol/L 138 137 138  Potassium 3.5 - 5.1 mmol/L 3.6 3.9 3.9  Chloride 98 - 111 mmol/L 109 105 105  CO2 22 - 32 mmol/L 20(L) 22 23  Calcium 8.9 - 10.3 mg/dL 7.9(L) 8.8(L) 9.0  Total Protein 6.5 - 8.1 g/dL - 7.9 -  Total Bilirubin 0.3 -  1.2 mg/dL - 0.5 -  Alkaline Phos 38 - 126 U/L - 74 -  AST 15 - 41 U/L - 17 -  ALT 0 - 44 U/L - 11 -    Lab Results  Component Value Date   WBC 3.8 (L) 03/01/2018   HGB 9.5 (L) 03/01/2018   HCT 31.9 (L) 03/01/2018   MCV 61.1 (L) 03/01/2018   PLT 340 03/01/2018   NEUTROABS 1.7 03/01/2018    ASSESSMENT & PLAN:   Microcytic anemia Lab review 03/01/2018: Hemoglobin 9.5, MCV 61.1 TSH: Normal 2.2 CRP: Less than 0.8 SPEP: Normal no M protein Hemolysis work-up: DAT negative, reticulocyte count 1.2%, absolute reticulocyte count 63.7, immature reticulocyte fraction: 14.3, haptoglobin normal 119, LDH normal 158 RBC folate: Normal Prior work-up showed a ferritin of 16 and normal iron parameters And normal B12  Hemoglobin electrophoresis: Beta thalassemia minor Counseling: I discussed with her the significance of beta thalassemia minor.  Patients can have mild to moderate anemia with this condition although majority of patients tend not to have any significant anemia.  Based on borderline iron ferritin levels, I recommended that the patient start oral iron therapy and return back to see [place] in 3 months with labs and follow-up.   Orders Placed This Encounter  Procedures  . Iron and TIBC    Standing Status:   Future    Standing Expiration Date:   03/06/2019  . Ferritin    Standing Status:   Future    Standing Expiration Date:   03/06/2019  . CBC with Differential (Cancer Center Only)    Standing Status:   Future    Standing Expiration Date:   03/07/2019   The patient has a good understanding of the overall plan. she agrees with it. she will call with any problems that may develop before the next visit here.   Tamsen Meek, MD 03/06/18

## 2018-03-06 NOTE — Assessment & Plan Note (Signed)
Lab review 03/01/2018: Hemoglobin 9.5, MCV 61.1 TSH: Normal 2.2 CRP: Less than 0.8 SPEP: Normal no M protein Hemolysis work-up: DAT negative, reticulocyte count 1.2%, absolute reticulocyte count 63.7, immature reticulocyte fraction: 14.3, haptoglobin normal 119, LDH normal 158 RBC folate: Normal Prior work-up showed a ferritin of 16 and normal iron parameters And normal B12  Hemoglobin electrophoresis: Beta thalassemia minor Counseling: I discussed with her the significance of beta thalassemia minor.  Patients can have mild to moderate anemia with this condition although majority of patients tend not to have any significant anemia.  Based on borderline iron ferritin levels, I recommended that the patient start oral iron therapy and return back to see us in 3 months with labs and follow-up.

## 2018-03-06 NOTE — Telephone Encounter (Signed)
Gave avs and calendar ° °

## 2018-06-05 ENCOUNTER — Inpatient Hospital Stay: Payer: BLUE CROSS/BLUE SHIELD | Attending: Adult Health

## 2018-06-07 ENCOUNTER — Telehealth: Payer: Self-pay | Admitting: Hematology and Oncology

## 2018-06-07 NOTE — Progress Notes (Unsigned)
HEMATOLOGY-ONCOLOGY TELEPHONE VISIT PROGRESS NOTE  I connected with Holly Newton on 06/08/18 at 10:00 AM EDT by telephone and verified that I am speaking with the correct person using two identifiers.  I discussed the limitations, risks, security and privacy concerns of performing an evaluation and management service by telephone and the availability of in person appointments.  I also discussed with the patient that there may be a patient responsible charge related to this service. The patient expressed understanding and agreed to proceed.   INTERVAL HISTORY: Holly Newton is a 48 y.o. with above-mentioned history of microcytic anemia currently on oral iron therapy.   REVIEW OF SYSTEMS:   Constitutional: Denies fevers, chills or abnormal weight loss Eyes: Denies blurriness of vision Ears, nose, mouth, throat, and face: Denies mucositis or sore throat Respiratory: Denies cough, dyspnea or wheezes Cardiovascular: Denies palpitation, chest discomfort Gastrointestinal:  Denies nausea, heartburn or change in bowel habits Skin: Denies abnormal skin rashes Lymphatics: Denies new lymphadenopathy or easy bruising Neurological:Denies numbness, tingling or new weaknesses Behavioral/Psych: Mood is stable, no new changes  Extremities: No lower extremity edema All other systems were reviewed with the patient and are negative. Observations/Objective:     Assessment Plan:  Microcytic anemia Lab review 03/01/2018: Hemoglobin 9.5, MCV 61.1 TSH: Normal 2.2 CRP: Less than 0.8 SPEP: Normal no M protein Hemolysis work-up: DAT negative, reticulocyte count 1.2%, absolute reticulocyte count 63.7, immature reticulocyte fraction: 14.3, haptoglobin normal 119, LDH normal 158 RBC folate: Normal Prior work-up showed a ferritin of 16 and normal iron parameters And normal B12 Hemoglobin electrophoresis: Beta thalassemia minor  Current treatment: Oral iron therapy because of borderline ferritin levels.  Since we do  not have any blood work we cannot determine if the oral iron is helping. We will plan to recheck her labs in 3 months and follow-up once a coronavirus infection subsides.     I discussed the assessment and treatment plan with the patient. The patient was provided an opportunity to ask questions and all were answered. The patient agreed with the plan and demonstrated an understanding of the instructions. The patient was advised to call back or seek an in-person evaluation if the symptoms worsen or if the condition fails to improve as anticipated.   I provided *** minutes of non-face-to-face time during this encounter. Tamsen Meek, MD

## 2018-06-07 NOTE — Telephone Encounter (Signed)
Called patient regarding upcoming Web[facility] appointment, patient does not have access to her microphone. Patient prefers this to be a telephone visit instead.

## 2018-06-08 ENCOUNTER — Inpatient Hospital Stay: Payer: BLUE CROSS/BLUE SHIELD | Admitting: Hematology and Oncology

## 2018-06-08 DIAGNOSIS — D509 Iron deficiency anemia, unspecified: Secondary | ICD-10-CM

## 2018-06-08 NOTE — Assessment & Plan Note (Signed)
Lab review 03/01/2018: Hemoglobin 9.5, MCV 61.1 TSH: Normal 2.2 CRP: Less than 0.8 SPEP: Normal no M protein Hemolysis work-up: DAT negative, reticulocyte count 1.2%, absolute reticulocyte count 63.7, immature reticulocyte fraction: 14.3, haptoglobin normal 119, LDH normal 158 RBC folate: Normal Prior work-up showed a ferritin of 16 and normal iron parameters And normal B12 Hemoglobin electrophoresis: Beta thalassemia minor  Current treatment: Oral iron therapy because of borderline ferritin levels.  Since we do not have any blood work we cannot determine if the oral iron is helping. We will plan to recheck her labs in 3 months and follow-up once a coronavirus infection subsides.

## 2019-03-06 ENCOUNTER — Other Ambulatory Visit: Payer: Self-pay

## 2019-03-06 ENCOUNTER — Inpatient Hospital Stay: Payer: BLUE CROSS/BLUE SHIELD | Attending: Hematology and Oncology

## 2019-03-06 DIAGNOSIS — R5383 Other fatigue: Secondary | ICD-10-CM | POA: Diagnosis not present

## 2019-03-06 DIAGNOSIS — D509 Iron deficiency anemia, unspecified: Secondary | ICD-10-CM | POA: Diagnosis present

## 2019-03-06 DIAGNOSIS — Z79899 Other long term (current) drug therapy: Secondary | ICD-10-CM | POA: Insufficient documentation

## 2019-03-06 DIAGNOSIS — R531 Weakness: Secondary | ICD-10-CM | POA: Insufficient documentation

## 2019-03-06 DIAGNOSIS — D563 Thalassemia minor: Secondary | ICD-10-CM | POA: Insufficient documentation

## 2019-03-06 LAB — CBC WITH DIFFERENTIAL (CANCER CENTER ONLY)
Abs Immature Granulocytes: 0.01 10*3/uL (ref 0.00–0.07)
Basophils Absolute: 0 10*3/uL (ref 0.0–0.1)
Basophils Relative: 1 %
Eosinophils Absolute: 0.1 10*3/uL (ref 0.0–0.5)
Eosinophils Relative: 2 %
HCT: 29.7 % — ABNORMAL LOW (ref 36.0–46.0)
Hemoglobin: 9 g/dL — ABNORMAL LOW (ref 12.0–15.0)
Immature Granulocytes: 0 %
Lymphocytes Relative: 43 %
Lymphs Abs: 1.6 10*3/uL (ref 0.7–4.0)
MCH: 18.3 pg — ABNORMAL LOW (ref 26.0–34.0)
MCHC: 30.3 g/dL (ref 30.0–36.0)
MCV: 60.2 fL — ABNORMAL LOW (ref 80.0–100.0)
Monocytes Absolute: 0.4 10*3/uL (ref 0.1–1.0)
Monocytes Relative: 9 %
Neutro Abs: 1.7 10*3/uL (ref 1.7–7.7)
Neutrophils Relative %: 45 %
Platelet Count: 298 10*3/uL (ref 150–400)
RBC: 4.93 MIL/uL (ref 3.87–5.11)
RDW: 16.4 % — ABNORMAL HIGH (ref 11.5–15.5)
WBC Count: 3.8 10*3/uL — ABNORMAL LOW (ref 4.0–10.5)
nRBC: 0 % (ref 0.0–0.2)

## 2019-03-06 LAB — FERRITIN: Ferritin: 10 ng/mL — ABNORMAL LOW (ref 11–307)

## 2019-03-06 LAB — IRON AND TIBC
Iron: 43 ug/dL (ref 41–142)
Saturation Ratios: 16 % — ABNORMAL LOW (ref 21–57)
TIBC: 276 ug/dL (ref 236–444)
UIBC: 233 ug/dL (ref 120–384)

## 2019-03-07 NOTE — Progress Notes (Signed)
Patient Care Team: System, Pcp Not In as PCP - General  DIAGNOSIS:    ICD-10-CM   1. Microcytic anemia  D50.9 CBC with Differential (Can[facility] Center Only)    Iron and TIBC    Ferritin  2. Iron deficiency anemia, unspecified iron deficiency anemia type  D50.9 CBC with Differential ([facility] Center Only)    Iron and TIBC    Ferritin    CHIEF COMPLIANT: Follow-up of microcytic anemia  INTERVAL HISTORY: Holly Newton is a 49 y.o. with above-mentioned history of microcytic anemia currently on oral iron therapy. Labs on 03/06/19 showed WBC 3.8, ANC 1.7, Hg 9.0, HCT 29.7, MCV 60.2, iron saturation 16%, ferritin 10. She presents to the clinic today for follow-up.  Patient complains of severe fatigue and generalized weakness.  She is also extremely paranoid about COVID-19 and worries about herself and her family.  She is not able to function very well at home probably because of severe iron deficiency.  She tells me that she has been compliant with oral iron therapy but it has not made a difference to her.  Her hemoglobin in spite of oral iron therapy has decreased to 9 g.  ALLERGIES:  has No Known Allergies.  MEDICATIONS:  Current Outpatient Medications  Medication Sig Dispense Refill   albuterol (PROVENTIL HFA;VENTOLIN HFA) 108 (90 Base) MCG/ACT inhaler Inhale 1-2 puffs into the lungs every 6 (six) hours as needed for wheezing or shortness of breath. 1 Inhaler 0   Cyanocobalamin (VITAMIN B-12 PO) Take by mouth daily.     VITAMIN D PO Take by mouth daily.     No current facility-administered medications for this visit.    PHYSICAL EXAMINATION: ECOG PERFORMANCE STATUS: 1 - Symptomatic but completely ambulatory  Vitals:   03/08/19 1230  BP: 125/72  Pulse: 98  Resp: 18  Temp: 98 F (36.7 C)  SpO2: 100%   There were no vitals filed for this visit.  LABORATORY DATA:  I have reviewed the data as listed CMP Latest Ref Rng & Units 02/17/2018 02/15/2018 07/04/2017  Glucose 70 - 99  mg/dL 80 98 97  BUN 6 - 20 mg/dL 8 11 14   Creatinine 0.44 - 1.00 mg/dL 2.59 5.63  Sodium 135 - 145 mmol/L 138 137 138  Potassium 3.5 - 5.1 mmol/L 3.6 3.9 3.9  Chloride 98 - 111 mmol/L 109 105 105  CO2 22 - 32 mmol/L 20(L) 22 23  Calcium 8.9 - 10.3 mg/dL 7.9(L) 8.8(L) 9.0  Total Protein 6.5 - 8.1 g/dL - 7.9 -  Total Bilirubin 0.3 - 1.2 mg/dL - 0.5 -  Alkaline Phos 38 - 126 U/L - 74 -  AST 15 - 41 U/L - 17 -  ALT 0 - 44 U/L - 11 -    Lab Results  Component Value Date   WBC 3.8 (L) 03/06/2019   HGB 9.0 (L) 03/06/2019   HCT 29.7 (L) 03/06/2019   MCV 60.2 (L) 03/06/2019   PLT 298 03/06/2019   NEUTROABS 1.7 03/06/2019    ASSESSMENT & PLAN:  Microcytic anemia Lab review 03/01/2018: Hemoglobin 9.5, MCV 61.1 SPEP: Normal no M protein Hemolysis work-up: Negative B12 and folate: Normal Prior work-up showed a ferritin of 16 and normal iron parameters  Hemoglobin electrophoresis: Beta thalassemia minor  Current treatment: Oral iron therapy because of borderline ferritin levels. Lab review: 03/05/2018: Iron saturation 16%, ferritin 10, hemoglobin 9, MCV 60.2, RDW 16.4 I discussed with the patient the pros and cons of intravenous iron therapy.  I believe that she could benefit from IV iron treatment. The decrease in MCV is most likely related to beta thalassemia and only slightly due to iron deficiency.  I wrote a letter to state that she should be off work until March end. Return to clinic in 2 months with labs and follow-up     Orders Placed This Encounter  Procedures   CBC with Differential ([facility] Only)    Standing Status:   Future    Standing Expiration Date:   03/07/2020   Iron and TIBC    Standing Status:   Future    Standing Expiration Date:   03/07/2020   Ferritin    Standing Status:   Future    Standing Expiration Date:   03/07/2020   The patient has a good understanding of the overall plan. she agrees with it. she will call with any problems that may  develop before the next visit here.  Total time spent: 30 mins including face to face time and time spent for planning, charting and coordination of care  Nicholas Lose, MD 03/08/2019  I, Holly Newton, am acting as scribe for Dr. Nicholas Lose.  I have reviewed the above documentation for accuracy and completeness, and I agree with the above.

## 2019-03-08 ENCOUNTER — Other Ambulatory Visit: Payer: Self-pay

## 2019-03-08 ENCOUNTER — Inpatient Hospital Stay (HOSPITAL_BASED_OUTPATIENT_CLINIC_OR_DEPARTMENT_OTHER): Payer: BLUE CROSS/BLUE SHIELD | Admitting: Hematology and Oncology

## 2019-03-08 DIAGNOSIS — D509 Iron deficiency anemia, unspecified: Secondary | ICD-10-CM | POA: Insufficient documentation

## 2019-03-08 NOTE — Assessment & Plan Note (Signed)
Lab review 03/01/2018: Hemoglobin 9.5, MCV 61.1 SPEP: Normal no M protein Hemolysis work-up: Negative B12 and folate: Normal Prior work-up showed a ferritin of 16 and normal iron parameters  Hemoglobin electrophoresis: Beta thalassemia minor  Current treatment: Oral iron therapy because of borderline ferritin levels. Lab review: 03/05/2018: Iron saturation 16%, ferritin 10, hemoglobin 9, MCV 60.2, RDW 16.4 I discussed with Holly Newton Holly pros and cons of intravenous iron therapy.  I believe that Holly Newton could benefit from IV iron treatment. Holly decrease in MCV is most likely related to beta thalassemia and only slightly due to iron deficiency.  Return to clinic in 3 months with labs and follow-up after IV iron infusion.

## 2019-03-15 ENCOUNTER — Ambulatory Visit: Payer: BLUE CROSS/BLUE SHIELD

## 2019-03-16 ENCOUNTER — Inpatient Hospital Stay: Payer: BLUE CROSS/BLUE SHIELD

## 2019-03-22 ENCOUNTER — Inpatient Hospital Stay: Payer: BLUE CROSS/BLUE SHIELD

## 2019-03-23 ENCOUNTER — Inpatient Hospital Stay: Payer: BLUE CROSS/BLUE SHIELD

## 2019-03-26 ENCOUNTER — Telehealth: Payer: Self-pay

## 2019-03-26 NOTE — Telephone Encounter (Signed)
RN returned call.  Voicemail left for return call.  

## 2019-03-27 ENCOUNTER — Telehealth: Payer: Self-pay | Admitting: *Deleted

## 2019-03-27 NOTE — Telephone Encounter (Signed)
Received call from pt requesting to schedule IV iron apts.  Pt was not aware of past missed apts.  Pt states she has a new phone number and that may be why she was never notified of upcoming apts.  High priority message sent to scheduling and to notify pt of upcoming apt date and times.

## 2019-03-27 NOTE — Telephone Encounter (Signed)
Received VM from pt, attempt x1 to return call, no answer, LVM to return call to the office.  

## 2019-03-27 NOTE — Telephone Encounter (Signed)
error 

## 2019-03-28 ENCOUNTER — Telehealth: Payer: Self-pay | Admitting: Hematology and Oncology

## 2019-03-28 ENCOUNTER — Telehealth: Payer: Self-pay | Admitting: *Deleted

## 2019-03-28 NOTE — Telephone Encounter (Signed)
Attempt x1 to contact pt regarding upcoming apt date and times.  No answer, left detailed voicemail with date and time of apts next week.

## 2019-03-28 NOTE — Telephone Encounter (Signed)
Scheduled appt per 2/2 sch message - unable to reach left message with appt date and time . And messaged RN to let them know pt was not reached .

## 2019-03-28 NOTE — Telephone Encounter (Signed)
Second attempt to contact pt regarding upcoming apt schedule.  No answer, left detailed message regarding apt date and time.

## 2019-04-05 ENCOUNTER — Encounter: Payer: Self-pay | Admitting: *Deleted

## 2019-04-05 ENCOUNTER — Inpatient Hospital Stay: Payer: BLUE CROSS/BLUE SHIELD | Attending: Hematology and Oncology

## 2019-04-05 ENCOUNTER — Other Ambulatory Visit: Payer: Self-pay

## 2019-04-05 VITALS — BP 119/76 | HR 79 | Temp 98.0°F | Resp 18

## 2019-04-05 DIAGNOSIS — D509 Iron deficiency anemia, unspecified: Secondary | ICD-10-CM | POA: Insufficient documentation

## 2019-04-05 DIAGNOSIS — R531 Weakness: Secondary | ICD-10-CM | POA: Insufficient documentation

## 2019-04-05 DIAGNOSIS — Z79899 Other long term (current) drug therapy: Secondary | ICD-10-CM | POA: Diagnosis not present

## 2019-04-05 DIAGNOSIS — R5383 Other fatigue: Secondary | ICD-10-CM | POA: Insufficient documentation

## 2019-04-05 MED ORDER — SODIUM CHLORIDE 0.9 % IV SOLN
200.0000 mg | Freq: Once | INTRAVENOUS | Status: AC
  Administered 2019-04-05: 200 mg via INTRAVENOUS
  Filled 2019-04-05: qty 200

## 2019-04-05 MED ORDER — SODIUM CHLORIDE 0.9 % IV SOLN
Freq: Once | INTRAVENOUS | Status: AC
  Filled 2019-04-05: qty 250

## 2019-04-05 MED ORDER — ACETAMINOPHEN 325 MG PO TABS
650.0000 mg | ORAL_TABLET | Freq: Once | ORAL | Status: AC
  Administered 2019-04-05: 14:00:00 650 mg via ORAL

## 2019-04-05 MED ORDER — ACETAMINOPHEN 325 MG PO TABS
ORAL_TABLET | ORAL | Status: AC
  Filled 2019-04-05: qty 2

## 2019-04-05 NOTE — Patient Instructions (Signed)

## 2019-04-06 ENCOUNTER — Inpatient Hospital Stay: Payer: BLUE CROSS/BLUE SHIELD

## 2019-04-06 ENCOUNTER — Other Ambulatory Visit: Payer: Self-pay

## 2019-04-06 VITALS — BP 127/77 | HR 71 | Temp 98.3°F | Resp 18

## 2019-04-06 DIAGNOSIS — D509 Iron deficiency anemia, unspecified: Secondary | ICD-10-CM

## 2019-04-06 MED ORDER — SODIUM CHLORIDE 0.9 % IV SOLN
Freq: Once | INTRAVENOUS | Status: AC
  Filled 2019-04-06: qty 250

## 2019-04-06 MED ORDER — ACETAMINOPHEN 325 MG PO TABS
650.0000 mg | ORAL_TABLET | Freq: Once | ORAL | Status: AC
  Administered 2019-04-06: 15:00:00 650 mg via ORAL

## 2019-04-06 MED ORDER — ACETAMINOPHEN 325 MG PO TABS
ORAL_TABLET | ORAL | Status: AC
  Filled 2019-04-06: qty 2

## 2019-04-06 MED ORDER — SODIUM CHLORIDE 0.9 % IV SOLN
200.0000 mg | Freq: Once | INTRAVENOUS | Status: AC
  Administered 2019-04-06: 15:00:00 200 mg via INTRAVENOUS
  Filled 2019-04-06: qty 200

## 2019-04-06 NOTE — Patient Instructions (Signed)

## 2019-04-12 ENCOUNTER — Other Ambulatory Visit: Payer: Self-pay

## 2019-04-12 ENCOUNTER — Inpatient Hospital Stay: Payer: BLUE CROSS/BLUE SHIELD

## 2019-04-12 VITALS — BP 121/79 | HR 88 | Temp 98.5°F | Resp 18

## 2019-04-12 DIAGNOSIS — D509 Iron deficiency anemia, unspecified: Secondary | ICD-10-CM

## 2019-04-12 MED ORDER — ACETAMINOPHEN 325 MG PO TABS
ORAL_TABLET | ORAL | Status: AC
  Filled 2019-04-12: qty 2

## 2019-04-12 MED ORDER — ACETAMINOPHEN 325 MG PO TABS
650.0000 mg | ORAL_TABLET | Freq: Once | ORAL | Status: AC
  Administered 2019-04-12: 650 mg via ORAL

## 2019-04-12 MED ORDER — SODIUM CHLORIDE 0.9 % IV SOLN
Freq: Once | INTRAVENOUS | Status: AC
  Filled 2019-04-12: qty 250

## 2019-04-12 MED ORDER — SODIUM CHLORIDE 0.9 % IV SOLN
200.0000 mg | Freq: Once | INTRAVENOUS | Status: AC
  Administered 2019-04-12: 200 mg via INTRAVENOUS
  Filled 2019-04-12: qty 200

## 2019-04-12 NOTE — Patient Instructions (Signed)

## 2019-04-13 ENCOUNTER — Inpatient Hospital Stay: Payer: BLUE CROSS/BLUE SHIELD

## 2019-04-13 ENCOUNTER — Other Ambulatory Visit: Payer: Self-pay

## 2019-04-13 VITALS — BP 101/62 | HR 80 | Temp 98.3°F | Resp 16

## 2019-04-13 DIAGNOSIS — D509 Iron deficiency anemia, unspecified: Secondary | ICD-10-CM

## 2019-04-13 MED ORDER — SODIUM CHLORIDE 0.9 % IV SOLN
Freq: Once | INTRAVENOUS | Status: AC
  Filled 2019-04-13: qty 250

## 2019-04-13 MED ORDER — ACETAMINOPHEN 325 MG PO TABS
650.0000 mg | ORAL_TABLET | Freq: Once | ORAL | Status: AC
  Administered 2019-04-13: 15:00:00 650 mg via ORAL

## 2019-04-13 MED ORDER — ACETAMINOPHEN 325 MG PO TABS
ORAL_TABLET | ORAL | Status: AC
  Filled 2019-04-13: qty 2

## 2019-04-13 MED ORDER — SODIUM CHLORIDE 0.9 % IV SOLN
200.0000 mg | Freq: Once | INTRAVENOUS | Status: AC
  Administered 2019-04-13: 200 mg via INTRAVENOUS
  Filled 2019-04-13: qty 200

## 2019-04-13 NOTE — Patient Instructions (Signed)
Iron Sucrose injection What is this medicine? IRON SUCROSE (AHY ern SOO krohs) is an iron complex. Iron is used to make healthy red blood cells, which carry oxygen and nutrients throughout the body. This medicine is used to treat iron deficiency anemia in people with chronic kidney disease. This medicine may be used for other purposes; ask your health care provider or pharmacist if you have questions. COMMON BRAND NAME(S): Venofer What should I tell my health care provider before I take this medicine? They need to know if you have any of these conditions:  anemia not caused by low iron levels  heart disease  high levels of iron in the blood  kidney disease  liver disease  an unusual or allergic reaction to iron, other medicines, foods, dyes, or preservatives  pregnant or trying to get pregnant  breast-feeding How should I use this medicine? This medicine is for infusion into a vein. It is given by a health care professional in a hospital or clinic setting. Talk to your pediatrician regarding the use of this medicine in children. While this drug may be prescribed for children as young as 2 years for selected conditions, precautions do apply. Overdosage: If you think you have taken too much of this medicine contact a poison control center or emergency room at once. NOTE: This medicine is only for you. Do not share this medicine with others. What if I miss a dose? It is important not to miss your dose. Call your doctor or health care professional if you are unable to keep an appointment. What may interact with this medicine? Do not take this medicine with any of the following medications:  deferoxamine  dimercaprol  other iron products This medicine may also interact with the following medications:  chloramphenicol  deferasirox This list may not describe all possible interactions. Give your health care provider a list of all the medicines, herbs, non-prescription drugs, or  dietary supplements you use. Also tell them if you smoke, drink alcohol, or use illegal drugs. Some items may interact with your medicine. What should I watch for while using this medicine? Visit your doctor or healthcare professional regularly. Tell your doctor or healthcare professional if your symptoms do not start to get better or if they get worse. You may need blood work done while you are taking this medicine. You may need to follow a special diet. Talk to your doctor. Foods that contain iron include: whole grains/cereals, dried fruits, beans, or peas, leafy green vegetables, and organ meats (liver, kidney). What side effects may I notice from receiving this medicine? Side effects that you should report to your doctor or health care professional as soon as possible:  allergic reactions like skin rash, itching or hives, swelling of the face, lips, or tongue  breathing problems  changes in blood pressure  cough  fast, irregular heartbeat  feeling faint or lightheaded, falls  fever or chills  flushing, sweating, or hot feelings  joint or muscle aches/pains  seizures  swelling of the ankles or feet  unusually weak or tired Side effects that usually do not require medical attention (report to your doctor or health care professional if they continue or are bothersome):  diarrhea  feeling achy  headache  irritation at site where injected  nausea, vomiting  stomach upset  tiredness This list may not describe all possible side effects. Call your doctor for medical advice about side effects. You may report side effects to FDA at 1-800-FDA-1088. Where should I keep   my medicine? This drug is given in a hospital or clinic and will not be stored at home. NOTE: This sheet is a summary. It may not cover all possible information. If you have questions about this medicine, talk to your doctor, pharmacist, or health care provider.  2020 Elsevier/Gold Standard (2010-11-19  17:14:35) Coronavirus (COVID-19) Are you at risk?  Are you at risk for the Coronavirus (COVID-19)?  To be considered HIGH RISK for Coronavirus (COVID-19), you have to meet the following criteria:  . Traveled to [place], [place], [place], [place] or [place]; or in the [place] to [place], [place], [place], or [place]; and have fever, cough, and shortness of breath within the last 2 weeks of travel OR . Been in close contact with a person diagnosed with COVID-19 within the last 2 weeks and have fever, cough, and shortness of breath . IF YOU DO NOT MEET THESE CRITERIA, YOU ARE CONSIDERED LOW RISK FOR COVID-19.  What to do if you are HIGH RISK for COVID-19?  . If you are having a medical emergency, call 911. . Seek medical care right away. Before you go to a doctor's office, urgent care or emergency department, call ahead and tell them about your recent travel, contact with someone diagnosed with COVID-19, and your symptoms. You should receive instructions from your physician's office regarding next steps of care.  . When you arrive at healthcare provider, tell the healthcare staff immediately you have returned from visiting [place], [place], [place], [place] or [place]; or traveled in the [place] to [place], [place], [place], or [place]; in the last two weeks or you have been in close contact with a person diagnosed with COVID-19 in the last 2 weeks.   . Tell the health care staff about your symptoms: fever, cough and shortness of breath. . After you have been seen by a medical provider, you will be either: o Tested for (COVID-19) and discharged home on quarantine except to seek medical care if symptoms worsen, and asked to  - Stay home and avoid contact with others until you get your results (4-5 days)  - Avoid travel on public transportation if possible (such as bus, train, or airplane) or o Sent to the Emergency Department by EMS for evaluation, COVID-19 testing, and  possible admission depending on your condition and test results.  What to do if you are LOW RISK for COVID-19?  Reduce your risk of any infection by using the same precautions used for avoiding the common cold or flu:  . Wash your hands often with soap and warm water for at least 20 seconds.  If soap and water are not readily available, use an alcohol-based hand sanitizer with at least 60% alcohol.  . If coughing or sneezing, cover your mouth and nose by coughing or sneezing into the elbow areas of your shirt or coat, into a tissue or into your sleeve (not your hands). . Avoid shaking hands with others and consider head nods or verbal greetings only. . Avoid touching your eyes, nose, or mouth with unwashed hands.  . Avoid close contact with people who are sick. . Avoid places or events with large numbers of people in one location, like concerts or sporting events. . Carefully consider travel plans you have or are making. . If you are planning any travel outside or inside the [place], visit the CDC's Travelers' Health webpage for the latest health notices. . If you have some symptoms but not all   symptoms, continue to monitor at home and seek medical attention if your symptoms worsen. . If you are having a medical emergency, call 911.   ADDITIONAL HEALTHCARE OPTIONS FOR PATIENTS  [place] [facility] / e-Visit: [email or web address]         [facility]: [phone]  [facility]: [phone]                   [facility]: [phone]  

## 2019-04-20 ENCOUNTER — Other Ambulatory Visit: Payer: Self-pay

## 2019-04-20 ENCOUNTER — Telehealth: Payer: Self-pay | Admitting: *Deleted

## 2019-04-20 ENCOUNTER — Other Ambulatory Visit: Payer: Self-pay | Admitting: *Deleted

## 2019-04-20 ENCOUNTER — Inpatient Hospital Stay: Payer: BLUE CROSS/BLUE SHIELD

## 2019-04-20 ENCOUNTER — Inpatient Hospital Stay (HOSPITAL_BASED_OUTPATIENT_CLINIC_OR_DEPARTMENT_OTHER): Payer: BLUE CROSS/BLUE SHIELD | Admitting: Medical

## 2019-04-20 VITALS — BP 121/67 | HR 71 | Temp 98.0°F | Resp 18

## 2019-04-20 DIAGNOSIS — D509 Iron deficiency anemia, unspecified: Secondary | ICD-10-CM

## 2019-04-20 LAB — CMP (CANCER CENTER ONLY)
ALT: 15 U/L (ref 0–44)
AST: 16 U/L (ref 15–41)
Albumin: 3.6 g/dL (ref 3.5–5.0)
Alkaline Phosphatase: 95 U/L (ref 38–126)
Anion gap: 9 (ref 5–15)
BUN: 8 mg/dL (ref 6–20)
CO2: 28 mmol/L (ref 22–32)
Calcium: 8.9 mg/dL (ref 8.9–10.3)
Chloride: 103 mmol/L (ref 98–111)
Creatinine: 0.68 mg/dL (ref 0.44–1.00)
GFR, Est AFR Am: >60 mL/min (ref >60)
GFR, Estimated: >60 mL/min (ref >60)
Glucose, Bld: 86 mg/dL (ref 70–99)
Potassium: 3.7 mmol/L (ref 3.5–5.1)
Sodium: 140 mmol/L (ref 135–145)
Total Bilirubin: 0.3 mg/dL (ref 0.3–1.2)
Total Protein: 7.7 g/dL (ref 6.5–8.1)

## 2019-04-20 LAB — IRON AND TIBC
Iron: 94 ug/dL (ref 41–142)
Saturation Ratios: 39 % (ref 21–57)
TIBC: 243 ug/dL (ref 236–444)
UIBC: 149 ug/dL (ref 120–384)

## 2019-04-20 LAB — CBC WITH DIFFERENTIAL (CANCER CENTER ONLY)
Abs Immature Granulocytes: 0.01 10*3/uL (ref 0.00–0.07)
Basophils Absolute: 0 10*3/uL (ref 0.0–0.1)
Basophils Relative: 1 %
Eosinophils Absolute: 0.1 10*3/uL (ref 0.0–0.5)
Eosinophils Relative: 2 %
HCT: 33.4 % — ABNORMAL LOW (ref 36.0–46.0)
Hemoglobin: 9.9 g/dL — ABNORMAL LOW (ref 12.0–15.0)
Immature Granulocytes: 0 %
Lymphocytes Relative: 42 %
Lymphs Abs: 1.5 10*3/uL (ref 0.7–4.0)
MCH: 18.3 pg — ABNORMAL LOW (ref 26.0–34.0)
MCHC: 29.6 g/dL — ABNORMAL LOW (ref 30.0–36.0)
MCV: 61.9 fL — ABNORMAL LOW (ref 80.0–100.0)
Monocytes Absolute: 0.3 10*3/uL (ref 0.1–1.0)
Monocytes Relative: 8 %
Neutro Abs: 1.6 10*3/uL — ABNORMAL LOW (ref 1.7–7.7)
Neutrophils Relative %: 47 %
Platelet Count: 309 10*3/uL (ref 150–400)
RBC: 5.4 MIL/uL — ABNORMAL HIGH (ref 3.87–5.11)
RDW: 16.7 % — ABNORMAL HIGH (ref 11.5–15.5)
WBC Count: 3.5 10*3/uL — ABNORMAL LOW (ref 4.0–10.5)
nRBC: 0 % (ref 0.0–0.2)

## 2019-04-20 LAB — SAMPLE TO BLOOD BANK

## 2019-04-20 LAB — FERRITIN: Ferritin: 273 ng/mL (ref 11–307)

## 2019-04-20 NOTE — Telephone Encounter (Signed)
Received call from pt stating she is experiencing dizziness x2 days.  Pt states she has difficulty walking and balance issues due to symptoms.  Per MD pt to come in for labs and follow up with MD today.  Apts scheduled and pt verbalized understanding of apt time.

## 2019-04-20 NOTE — Patient Instructions (Signed)

## 2019-04-20 NOTE — Progress Notes (Signed)
[facility] Progress Note   Holly Newton [id number] [birth year] 49 y.o.  Holly Newton is managed by Dr. Serena Croissant  Actively treated with chemotherapy/immunotherapy/hormonal therapy: no  Next scheduled appointment with provider: 05/08/2019.  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  A CBC returned with a hemoglobin of 9.9 and a hematocrit of 33.4.  And MCV returned at 61.9.  Her iron saturation returned at 39%.  A ferritin returned at 273.  The patient inquires if her appointment with Dr. Pamelia Hoit which was scheduled for next month could be rescheduled for a later date given her visit today.  The patient was encouraged to try meclizine should her symptoms continue with as I discussed with her that this could possibly represent labyrinthitis.  She expresses understanding and agreement with this plan.  Please see After Visit Summary for patient specific instructions.  Future Appointments  Date Time Provider Department Center  05/08/2019 11:00 AM [facility] 4 [facility] None  05/10/2019 10:15 AM Serena Croissant, MD [facility] None    No orders of the defined types were placed in this encounter.      Subjective:   Patient ID:  [name] is a 49 y.o. (DOB [birth year]) female.  Chief Complaint:  Chief Complaint  Patient presents with  . Dizziness    HPI Holly Newton  Is a 49 y.o. female with a diagnosis of an iron deficiency anemia and beta thalassemia minor.  She is followed by Dr. Serena Croissant.  She presents to the clinic today with a 2-day history of dizziness, fatigue, and headaches.  A CBC returned with a hemoglobin of 9.9 and a hematocrit of 33.4.  An MCV returned at 61.9.  Her iron saturation returned at 39%.  A ferritin returned  at 273.  She denies any other issues of concern.  She denies nausea, vomiting, diarrhea, chest pain, or shortness of breath.  She presents to the clinic today with her husband.  Medications: I have reviewed the patient's current medications.  Allergies: No Known Allergies  Past Medical History:  Diagnosis Date  . Anemia   . Anemia   . Asthma   . Bleeding hemorrhoid     Past Surgical History:  Procedure Laterality Date  . EYE SURGERY  1977    Family History  Problem Relation Age of Onset  . Heart attack Father   . Anemia Daughter     Social History   Socioeconomic History  . Marital status: Married    Spouse name: Not on file  . Number of children: Not on file  . Years of education: Not on file  . Highest education level: Not on file  Occupational History  . Not on file  Tobacco Use  . Smoking status: Never Smoker  . Smokeless tobacco: Never Used  Substance and Sexual Activity  . Alcohol use: No  . Drug use: No  . Sexual activity: Yes  Other Topics Concern  . Not on file  Social History Narrative   Shawnetta is a [place] woman, she moved to the [place] in 2002.  Her primary language is Arabic.  She has 5 children, they are from her first marriage.  She has been divorced and is remarried.  She lives with her husband in [place], [place], and  previously worked in Product/process development scientist, however she has been on a leave of absence since her dad suffered a heart attack in 09/2017 so that she can help him.     Social Determinants of Health   Financial Resource Strain:   . Difficulty of Paying Living Expenses: Not on file  Food Insecurity:   . Worried About Programme researcher, broadcasting/film/video in the Last Year: Not on file  . Ran Out of Food in the Last Year: Not on file  Transportation Needs:   . Lack of Transportation (Medical): Not on file  . Lack of Transportation (Non-Medical): Not on file  Physical Activity:   . Days of Exercise per Week: Not on file  . Minutes of Exercise per  Session: Not on file  Stress:   . Feeling of Stress : Not on file  Social Connections:   . Frequency of Communication with Friends and Family: Not on file  . Frequency of Social Gatherings with Friends and Family: Not on file  . Attends Religious Services: Not on file  . Active Member of Clubs or Organizations: Not on file  . Attends Banker Meetings: Not on file  . Marital Status: Not on file  Intimate Partner Violence:   . Fear of Current or Ex-Partner: Not on file  . Emotionally Abused: Not on file  . Physically Abused: Not on file  . Sexually Abused: Not on file    Past Medical History, Surgical history, Social history, and Family history were reviewed and updated as appropriate.   Please see review of systems for further details on the patient's review from today.   Review of Systems:  Review of Systems  Constitutional: Positive for fatigue. Negative for appetite change, chills, diaphoresis and fever.  HENT: Negative for dental problem, mouth sores and trouble swallowing.   Respiratory: Negative for cough, chest tightness and shortness of breath.   Cardiovascular: Negative for chest pain and palpitations.  Gastrointestinal: Negative for constipation, diarrhea, nausea and vomiting.  Neurological: Positive for dizziness and headaches. Negative for syncope and weakness.    Objective:   Physical Exam:  BP 121/67 (BP Location: Left Arm, Patient Position: Sitting)   Pulse 71   Temp 98 F (36.7 C) (Oral)   Resp 18   SpO2 100%  ECOG: 0  Orthostatic Blood Pressure: Blood pressure:   sitting 128/74, standing 136/93 Pulse:   sitting 81, standing 90    Physical Exam Constitutional:      General: She is not in acute distress.    Appearance: She is not diaphoretic.  HENT:     Head: Normocephalic and atraumatic.  Eyes:     General: No scleral icterus.       Right eye: No discharge.        Left eye: No discharge.     Conjunctiva/sclera: Conjunctivae  normal.  Cardiovascular:     Rate and Rhythm: Normal rate and regular rhythm.     Heart sounds: Normal heart sounds. No murmur. No friction rub. No gallop.   Pulmonary:     Effort: Pulmonary effort is normal. No respiratory distress.     Breath sounds: Normal breath sounds. No wheezing or rales.  Skin:    General: Skin is warm and dry.     Findings: No erythema or rash.  Neurological:     Mental Status: She is alert.     Coordination: Coordination normal.     Gait: Gait normal.  Psychiatric:  Mood and Affect: Mood normal.        Behavior: Behavior normal.        Thought Content: Thought content normal.        Judgment: Judgment normal.     Lab Review:     Component Value Date/Time   NA 140 04/20/2019 1237   NA 139 06/29/2017 1029   K 3.7 04/20/2019 1237   CL 103 04/20/2019 1237   CO2 28 04/20/2019 1237   GLUCOSE 86 04/20/2019 1237   BUN 8 04/20/2019 1237   BUN 7 06/29/2017 1029   CREATININE 0.68 04/20/2019 1237   CALCIUM 8.9 04/20/2019 1237   PROT 7.7 04/20/2019 1237   PROT 7.0 06/29/2017 1029   ALBUMIN 3.6 04/20/2019 1237   ALBUMIN 3.8 06/29/2017 1029   AST 16 04/20/2019 1237   ALT 15 04/20/2019 1237   ALKPHOS 95 04/20/2019 1237   BILITOT 0.3 04/20/2019 1237   GFRNONAA >60 04/20/2019 1237   GFRAA >60 04/20/2019 1237       Component Value Date/Time   WBC 3.5 (L) 04/20/2019 1237   WBC 3.1 (L) 02/17/2018 0411   RBC 5.40 (H) 04/20/2019 1237   HGB 9.9 (L) 04/20/2019 1237   HGB 9.8 (L) 06/29/2017 1104   HCT 33.4 (L) 04/20/2019 1237   HCT 30.4 (L) 02/17/2018 0754   PLT 309 04/20/2019 1237   PLT 356 06/29/2017 1104   MCV 61.9 (L) 04/20/2019 1237   MCV 61 (L) 06/29/2017 1104   MCV 59.8 (A) 06/29/2017 1034   MCH 18.3 (L) 04/20/2019 1237   MCHC 29.6 (L) 04/20/2019 1237   RDW 16.7 (H) 04/20/2019 1237   RDW 17.2 (H) 06/29/2017 1104   LYMPHSABS 1.5 04/20/2019 1237   LYMPHSABS 1.5 06/29/2017 1104   MONOABS 0.3 04/20/2019 1237   EOSABS 0.1 04/20/2019 1237    EOSABS 0.1 06/29/2017 1104   BASOSABS 0.0 04/20/2019 1237   BASOSABS 0.0 06/29/2017 1104   -------------------------------  Imaging from last 24 hours (if applicable):  Radiology interpretation: No results found.

## 2019-04-28 IMAGING — DX DG FOOT COMPLETE 3+V*L*
3 series · 3 of 3 positions shown · non-contrast
Comparison: None.

CLINICAL DATA: Left foot pain after slipping on ice [REDACTED].

EXAM:
LEFT FOOT - COMPLETE 3+ VIEW

[foot ap]
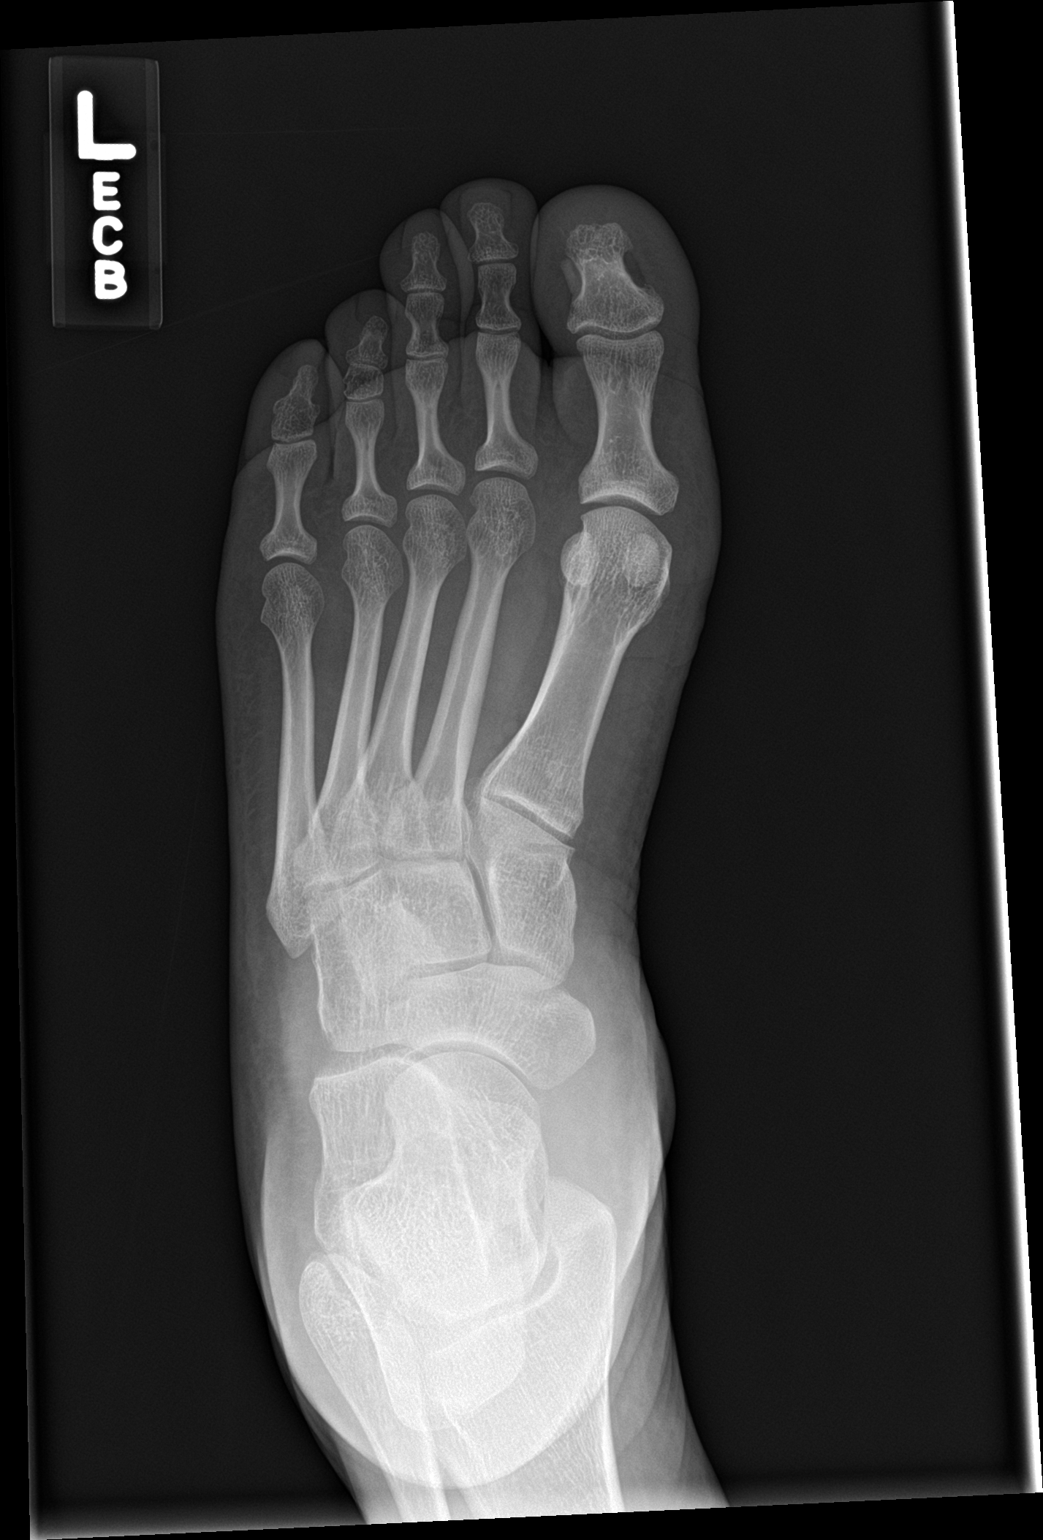

[foot obl]
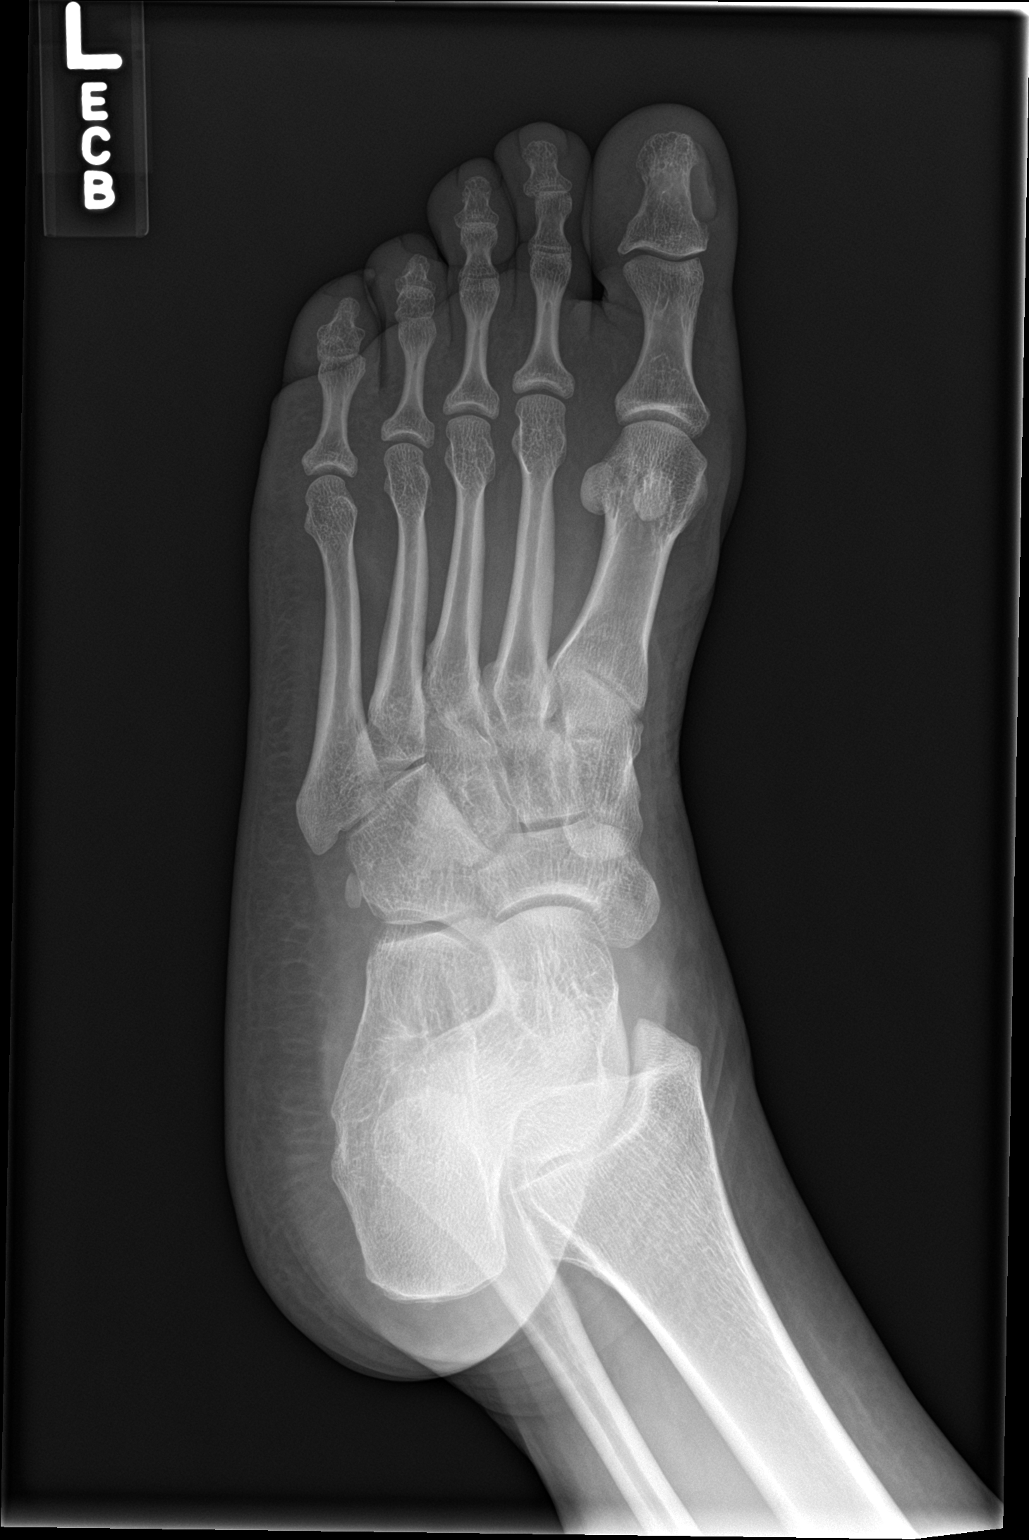

[foot lat]
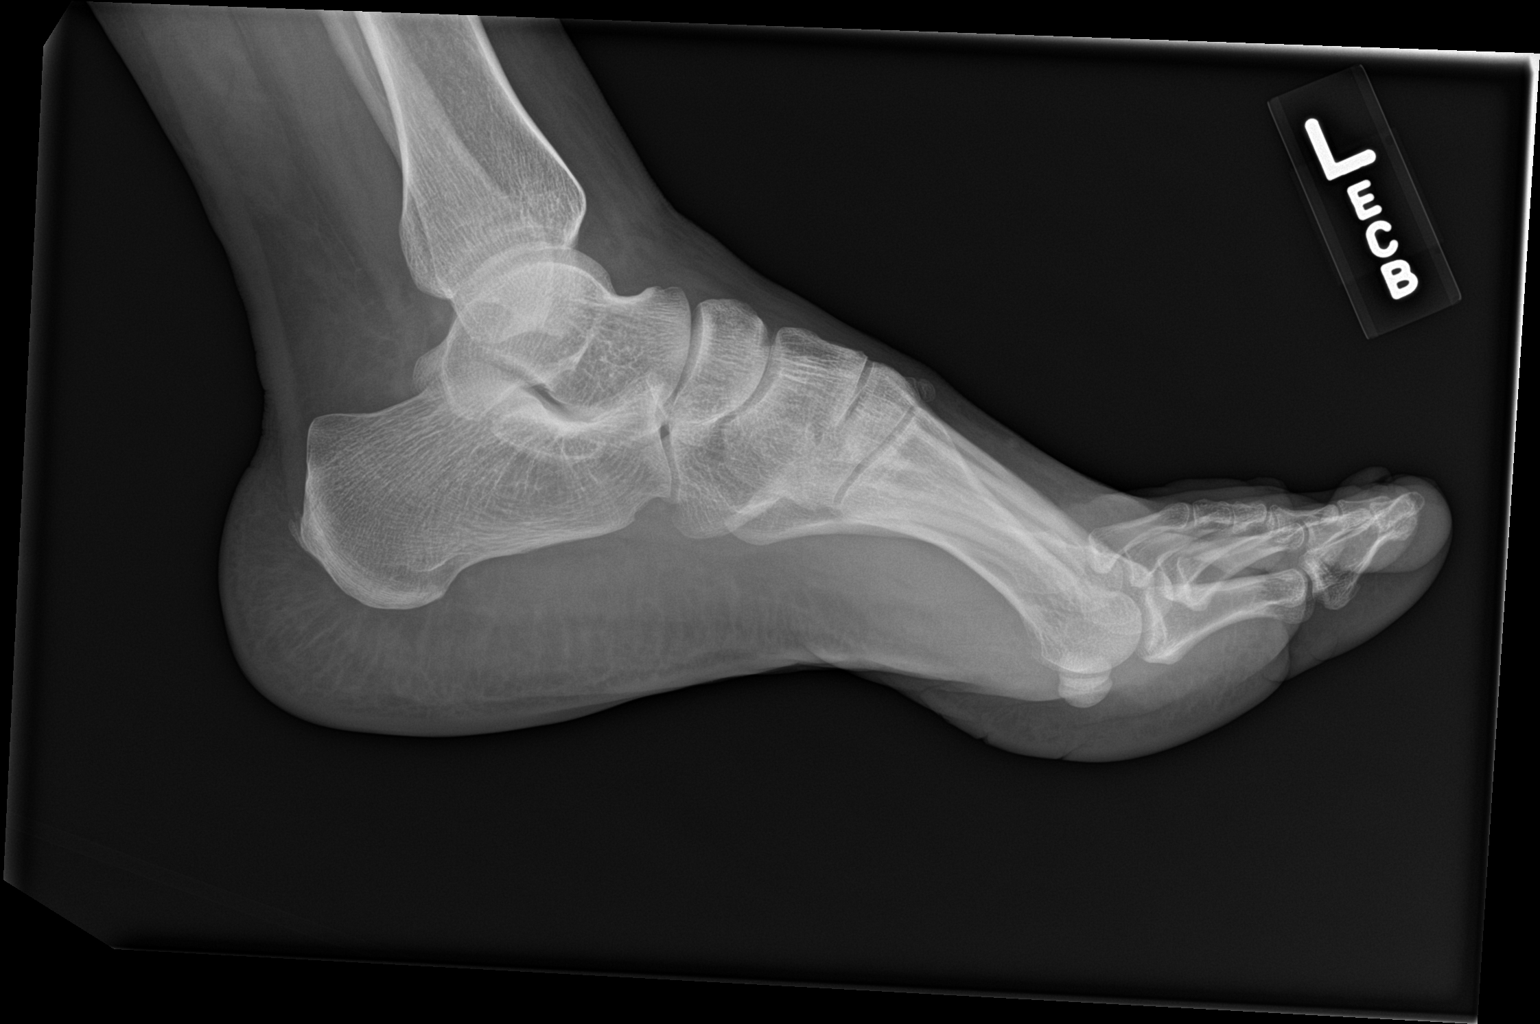

[3 of 3 positions shown; findings below may reference images not displayed]

FINDINGS: There is no evidence of fracture or dislocation. There is no
evidence of arthropathy or other focal bone abnormality. Small
Achilles enthesophyte. Soft tissues are unremarkable.
IMPRESSION: Negative.

## 2019-05-08 ENCOUNTER — Inpatient Hospital Stay: Payer: BLUE CROSS/BLUE SHIELD | Attending: Hematology and Oncology

## 2019-05-08 ENCOUNTER — Other Ambulatory Visit: Payer: Self-pay

## 2019-05-08 DIAGNOSIS — Z79899 Other long term (current) drug therapy: Secondary | ICD-10-CM | POA: Diagnosis not present

## 2019-05-08 DIAGNOSIS — R5383 Other fatigue: Secondary | ICD-10-CM | POA: Insufficient documentation

## 2019-05-08 DIAGNOSIS — D509 Iron deficiency anemia, unspecified: Secondary | ICD-10-CM | POA: Diagnosis not present

## 2019-05-08 DIAGNOSIS — D563 Thalassemia minor: Secondary | ICD-10-CM | POA: Diagnosis not present

## 2019-05-08 LAB — FERRITIN: Ferritin: 145 ng/mL (ref 11–307)

## 2019-05-08 LAB — CBC WITH DIFFERENTIAL (CANCER CENTER ONLY)
Abs Immature Granulocytes: 0.01 10*3/uL (ref 0.00–0.07)
Basophils Absolute: 0 10*3/uL (ref 0.0–0.1)
Basophils Relative: 1 %
Eosinophils Absolute: 0.1 10*3/uL (ref 0.0–0.5)
Eosinophils Relative: 2 %
HCT: 32.9 % — ABNORMAL LOW (ref 36.0–46.0)
Hemoglobin: 9.8 g/dL — ABNORMAL LOW (ref 12.0–15.0)
Immature Granulocytes: 0 %
Lymphocytes Relative: 44 %
Lymphs Abs: 1.7 10*3/uL (ref 0.7–4.0)
MCH: 18.4 pg — ABNORMAL LOW (ref 26.0–34.0)
MCHC: 29.8 g/dL — ABNORMAL LOW (ref 30.0–36.0)
MCV: 61.8 fL — ABNORMAL LOW (ref 80.0–100.0)
Monocytes Absolute: 0.3 10*3/uL (ref 0.1–1.0)
Monocytes Relative: 8 %
Neutro Abs: 1.8 10*3/uL (ref 1.7–7.7)
Neutrophils Relative %: 45 %
Platelet Count: 310 10*3/uL (ref 150–400)
RBC: 5.32 MIL/uL — ABNORMAL HIGH (ref 3.87–5.11)
RDW: 16.5 % — ABNORMAL HIGH (ref 11.5–15.5)
WBC Count: 3.9 10*3/uL — ABNORMAL LOW (ref 4.0–10.5)
nRBC: 0 % (ref 0.0–0.2)

## 2019-05-08 LAB — IRON AND TIBC
Iron: 59 ug/dL (ref 41–142)
Saturation Ratios: 24 % (ref 21–57)
TIBC: 243 ug/dL (ref 236–444)
UIBC: 183 ug/dL (ref 120–384)

## 2019-05-09 NOTE — Progress Notes (Signed)
Patient Care Team: System, Pcp Not In as PCP - General  DIAGNOSIS:    ICD-10-CM   1. Iron deficiency anemia, unspecified iron deficiency anemia type  D50.9      CHIEF COMPLIANT: Follow-up of microcytic anemia  INTERVAL HISTORY: Holly Newton is a 49 y.o. with above-mentioned history of microcytic anemia treated with IV iron.Labs on 05/08/19 showed WBC 3.9, ANC 1.8, Hg 9.8, HCT 32.9, MCV 32.9, iron saturation 24%, ferritin 145. She presents to the clinic today for follow-up.   Patient received IV iron but in spite of that she feels extremely tired and cannot function at home.  She is very sleepy and does not have energy to do a whole lot of activities.  ALLERGIES:  has No Known Allergies.  PHYSICAL EXAMINATION: ECOG PERFORMANCE STATUS: 1 - Symptomatic but completely ambulatory  Vitals:   05/10/19 1041  BP: 104/68  Pulse: 98  Resp: 18  Temp: 97.8 F (36.6 C)  SpO2: 100%   Filed Weights   05/10/19 1041  Weight: 210 lb (95.3 kg)    LABORATORY DATA:  I have reviewed the data as listed CMP Latest Ref Rng & Units 04/20/2019 02/17/2018 02/15/2018  Glucose 70 - 99 mg/dL 86 80 98  BUN 6 - 20 mg/dL '8 8 11  '$ Creatinine 0.44 - 1.00 mg/dL 0.68 0.63 0.53  Sodium 135 - 145 mmol/L 140 138 137  Potassium 3.5 - 5.1 mmol/L 3.7 3.6 3.9  Chloride 98 - 111 mmol/L 103 109 105  CO2 22 - 32 mmol/L 28 20(L) 22  Calcium 8.9 - 10.3 mg/dL 8.9 7.9(L) 8.8(L)  Total Protein 6.5 - 8.1 g/dL 7.7 - 7.9  Total Bilirubin 0.3 - 1.2 mg/dL 0.3 - 0.5  Alkaline Phos 38 - 126 U/L 95 - 74  AST 15 - 41 U/L 16 - 17  ALT 0 - 44 U/L 15 - 11    Lab Results  Component Value Date   WBC 3.9 (L) 05/08/2019   HGB 9.8 (L) 05/08/2019   HCT 32.9 (L) 05/08/2019   MCV 61.8 (L) 05/08/2019   PLT 310 05/08/2019   NEUTROABS 1.8 05/08/2019    ASSESSMENT & PLAN:  Iron deficiency anemia Lab review 03/01/2018: Hemoglobin 9.5, MCV 61.1 SPEP: Normal no M protein Hemolysis work-up: Negative B12 and folate: Normal Prior  work-up showed a ferritin of 16 and normal iron parameters  Hemoglobin electrophoresis: Beta thalassemia minor  Current treatment: IV iron: February 2021 Lab review: 05/08/2019: Hemoglobin 9.8, MCV 61.8, ferritin 145, iron saturation 24%  There is no indication for IV iron therapy at this time. The decrease in MCV is most likely related to beta thalassemia.  Patient continues to feel extremely fatigued and is still unable to go to work.  I wrote to her work excuse until end of April. I discussed with her that we may need to consider doing a bone marrow biopsy to evaluate the cause of her persistent anemia in spite of fixing her iron levels.  Return to clinic in 1 month with labs.  At that time will make a decision on doing a bone marrow biopsy.   No orders of the defined types were placed in this encounter.  The patient has a good understanding of the overall plan. she agrees with it. she will call with any problems that may develop before the next visit here.  Total time spent: 30 mins including face to face time and time spent for planning, charting and coordination of care  Bruce Mayers,  Loleta Dicker, MD 05/10/2019  Julious Oka Dorshimer, am acting as scribe for Dr. Nicholas Lose.  I have reviewed the above documentation for accuracy and completeness, and I agree with the above.

## 2019-05-10 ENCOUNTER — Inpatient Hospital Stay (HOSPITAL_BASED_OUTPATIENT_CLINIC_OR_DEPARTMENT_OTHER): Payer: BLUE CROSS/BLUE SHIELD | Admitting: Hematology and Oncology

## 2019-05-10 ENCOUNTER — Other Ambulatory Visit: Payer: Self-pay

## 2019-05-10 DIAGNOSIS — D509 Iron deficiency anemia, unspecified: Secondary | ICD-10-CM | POA: Diagnosis not present

## 2019-05-10 NOTE — Assessment & Plan Note (Signed)
Lab review 03/01/2018: Hemoglobin 9.5, MCV 61.1 SPEP: Normal no M protein Hemolysis work-up: Negative B12 and folate: Normal Prior work-up showed a ferritin of 16 and normal iron parameters  Hemoglobin electrophoresis: Beta thalassemia minor  Current treatment: IV iron: February 2021 Lab review: 05/08/2019: Hemoglobin 9.8, MCV 61.8, ferritin 145, iron saturation 24%  There is no indication for IV iron therapy at this time. The decrease in MCV is most likely related to beta thalassemia.  Return to clinic in 3 months with labs and follow-up

## 2019-05-28 ENCOUNTER — Other Ambulatory Visit: Payer: Self-pay | Admitting: Hematology and Oncology

## 2019-06-08 ENCOUNTER — Other Ambulatory Visit: Payer: Self-pay | Admitting: *Deleted

## 2019-06-08 DIAGNOSIS — D509 Iron deficiency anemia, unspecified: Secondary | ICD-10-CM

## 2019-06-11 ENCOUNTER — Inpatient Hospital Stay: Payer: BLUE CROSS/BLUE SHIELD | Attending: Hematology and Oncology

## 2019-06-28 ENCOUNTER — Telehealth: Payer: Self-pay | Admitting: Hematology and Oncology

## 2019-06-28 NOTE — Telephone Encounter (Signed)
Scheduled appt per 5/6 sch message - pt aware of new appt date and time

## 2019-07-01 NOTE — Progress Notes (Signed)
   Patient Care Team: System, Pcp Not In as PCP - General  DIAGNOSIS:    ICD-10-CM   1. Iron deficiency anemia, unspecified iron deficiency anemia type  D50.9     CHIEF COMPLIANT: Follow-up of microcytic anemia  INTERVAL HISTORY: ELLANA KAWA is a 49 y.o. with above-mentioned history of microcytic anemia treated with IV iron.She presents to the clinic todayfor follow-up.  ALLERGIES:  has No Known Allergies.  MEDICATIONS:  No current outpatient medications on file.   No current facility-administered medications for this visit.    PHYSICAL EXAMINATION: ECOG PERFORMANCE STATUS: 1 - Symptomatic but completely ambulatory  Vitals:   07/02/19 1453  BP: 111/64  Pulse: 73  Resp: 18  Temp: 98.5 F (36.9 C)  SpO2: 100%   Filed Weights   07/02/19 1453  Weight: 211 lb 8 oz (95.9 kg)    LABORATORY DATA:  I have reviewed the data as listed CMP Latest Ref Rng & Units 04/20/2019 02/17/2018 02/15/2018  Glucose 70 - 99 mg/dL 86 80 98  BUN 6 - 20 mg/dL 8 8 11   Creatinine 0.44 - 1.00 mg/dL 3.82 5.05  Sodium 135 - 145 mmol/L 140 138 137  Potassium 3.5 - 5.1 mmol/L 3.7 3.6 3.9  Chloride 98 - 111 mmol/L 103 109 105  CO2 22 - 32 mmol/L 28 20(L) 22  Calcium 8.9 - 10.3 mg/dL 8.9 7.9(L) 8.8(L)  Total Protein 6.5 - 8.1 g/dL 7.7 - 7.9  Total Bilirubin 0.3 - 1.2 mg/dL 0.3 - 0.5  Alkaline Phos 38 - 126 U/L 95 - 74  AST 15 - 41 U/L 16 - 17  ALT 0 - 44 U/L 15 - 11    Lab Results  Component Value Date   WBC 3.7 (L) 07/02/2019   HGB 10.0 (L) 07/02/2019   HCT 33.2 (L) 07/02/2019   MCV 61.3 (L) 07/02/2019   PLT 345 07/02/2019   NEUTROABS 1.5 (L) 07/02/2019    ASSESSMENT & PLAN:  Iron deficiency anemia Lab review 03/01/2018: Hemoglobin 9.5, MCV 61.1 SPEP: Normal no M protein Hemolysis work-up: Negative B12 and folate: Normal Prior work-up showed a ferritin of 16 and normal iron parameters  Hemoglobin electrophoresis: Beta thalassemia minor  Current treatment: IV iron:  February 2021 Lab review: Hemoglobin 10 The decrease in MCV is most likely related to beta thalassemia.  Patient plans to go to [place] in July and may come back in September. I instructed her to call October back when she comes back so that we can recheck her blood work and iron studies.   No orders of the defined types were placed in this encounter.  The patient has a good understanding of the overall plan. she agrees with it. she will call with any problems that may develop before the next visit here.  Total time spent: 20 mins including face to face time and time spent for planning, charting and coordination of care  [place], MD 07/02/2019  I, 09/01/2019 Dorshimer, am acting as scribe for Dr. Kirt Boys.  I have reviewed the above documentation for accuracy and completeness, and I agree with the above.

## 2019-07-02 ENCOUNTER — Inpatient Hospital Stay: Payer: BLUE CROSS/BLUE SHIELD | Attending: Hematology and Oncology | Admitting: Hematology and Oncology

## 2019-07-02 ENCOUNTER — Inpatient Hospital Stay: Payer: BLUE CROSS/BLUE SHIELD

## 2019-07-02 ENCOUNTER — Other Ambulatory Visit: Payer: Self-pay

## 2019-07-02 DIAGNOSIS — D509 Iron deficiency anemia, unspecified: Secondary | ICD-10-CM

## 2019-07-02 DIAGNOSIS — D563 Thalassemia minor: Secondary | ICD-10-CM | POA: Insufficient documentation

## 2019-07-02 LAB — CBC WITH DIFFERENTIAL (CANCER CENTER ONLY)
Abs Immature Granulocytes: 0.01 10*3/uL (ref 0.00–0.07)
Basophils Absolute: 0 10*3/uL (ref 0.0–0.1)
Basophils Relative: 1 %
Eosinophils Absolute: 0.1 10*3/uL (ref 0.0–0.5)
Eosinophils Relative: 2 %
HCT: 33.2 % — ABNORMAL LOW (ref 36.0–46.0)
Hemoglobin: 10 g/dL — ABNORMAL LOW (ref 12.0–15.0)
Immature Granulocytes: 0 %
Lymphocytes Relative: 48 %
Lymphs Abs: 1.8 10*3/uL (ref 0.7–4.0)
MCH: 18.5 pg — ABNORMAL LOW (ref 26.0–34.0)
MCHC: 30.1 g/dL (ref 30.0–36.0)
MCV: 61.3 fL — ABNORMAL LOW (ref 80.0–100.0)
Monocytes Absolute: 0.3 10*3/uL (ref 0.1–1.0)
Monocytes Relative: 8 %
Neutro Abs: 1.5 10*3/uL — ABNORMAL LOW (ref 1.7–7.7)
Neutrophils Relative %: 41 %
Platelet Count: 345 10*3/uL (ref 150–400)
RBC: 5.42 MIL/uL — ABNORMAL HIGH (ref 3.87–5.11)
RDW: 16.2 % — ABNORMAL HIGH (ref 11.5–15.5)
WBC Count: 3.7 10*3/uL — ABNORMAL LOW (ref 4.0–10.5)
nRBC: 0 % (ref 0.0–0.2)

## 2019-07-02 LAB — IRON AND TIBC
Iron: 46 ug/dL (ref 41–142)
Saturation Ratios: 17 % — ABNORMAL LOW (ref 21–57)
TIBC: 264 ug/dL (ref 236–444)
UIBC: 218 ug/dL (ref 120–384)

## 2019-07-02 LAB — FERRITIN: Ferritin: 137 ng/mL (ref 11–307)

## 2019-07-02 NOTE — Assessment & Plan Note (Signed)
Lab review 03/01/2018: Hemoglobin 9.5, MCV 61.1 SPEP: Normal no M protein Hemolysis work-up: Negative B12 and folate: Normal Prior work-up showed a ferritin of 16 and normal iron parameters  Hemoglobin electrophoresis: Beta thalassemia minor  Current treatment: IV iron: February 2021 Lab review: The decrease in MCV is most likely related to beta thalassemia.  Patient continues to feel extremely fatigued and is still unable to go to work.  I wrote to her work excuse until end of April. I discussed with her that we may need to consider doing a bone marrow biopsy to evaluate the cause of her persistent anemia in spite of fixing her iron levels.  Return to clinic in 1 month with labs.  At that time will make a decision on doing a bone marrow biopsy.

## 2020-05-06 IMAGING — CT CT HEAD W/O CM
4 series · 17 of 47 positions shown, 19 images · non-contrast
Comparison: 03/17/2012

CLINICAL DATA: Altered level of consciousness

EXAM:
CT HEAD WITHOUT CONTRAST
TECHNIQUE: Contiguous axial images were obtained from the base of the skull
through the vertex without intravenous contrast.

[Series 3: head wo · axial · 0.43mm/px · z∈[-168,-33]mm · 7 of 37 slices shown, 9 images]
[im 5/37  brain]
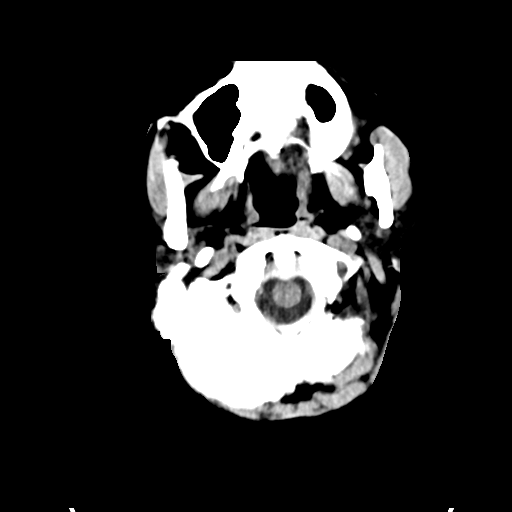
[im 5/37  bone]
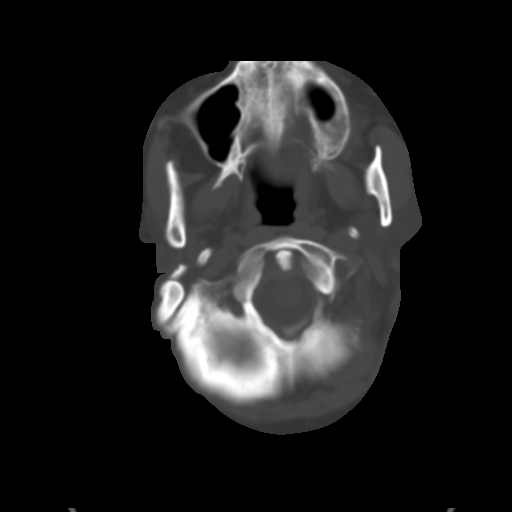
[im 10/37  brain]
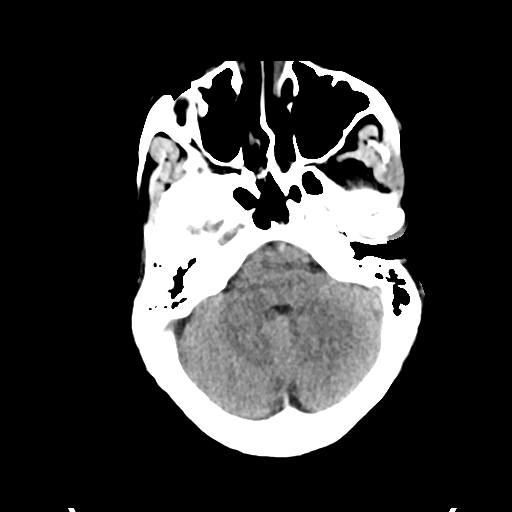
[im 14/37  brain]
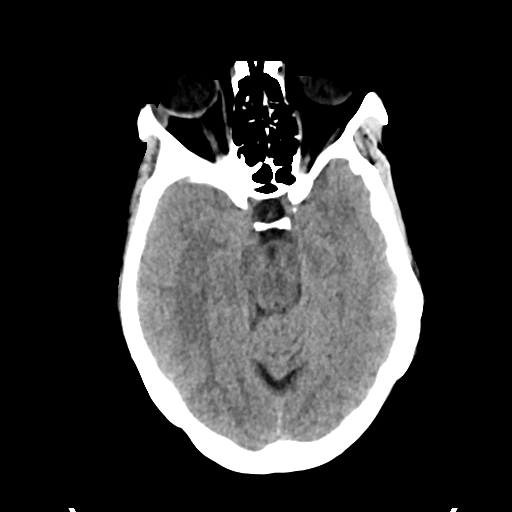
[im 19/37  brain]
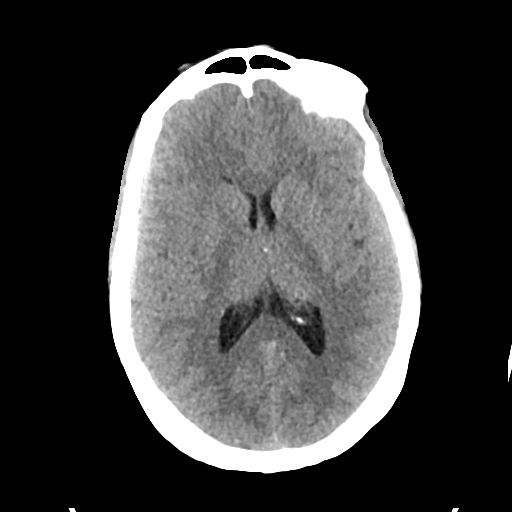
[im 23/37  brain]
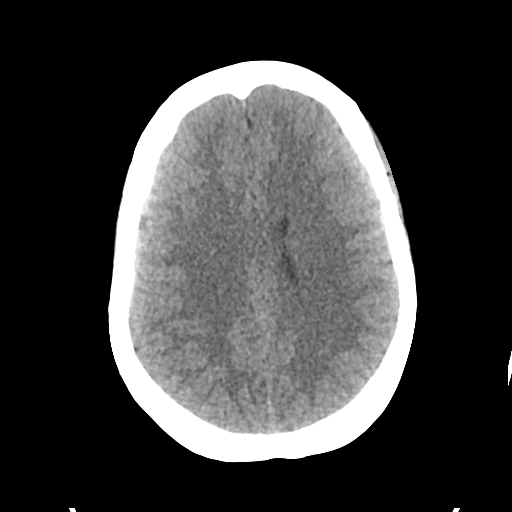
[im 23/37  bone]
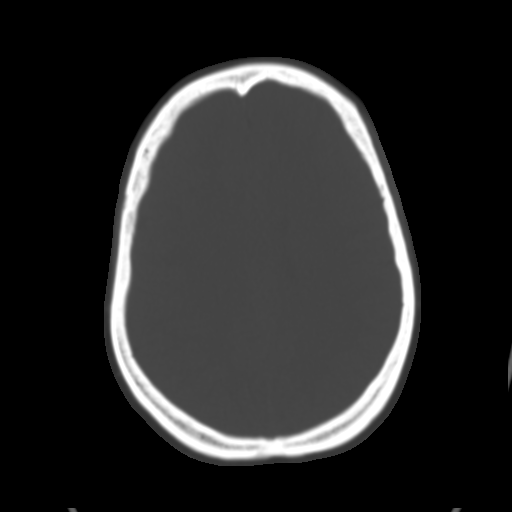
[im 28/37  brain]
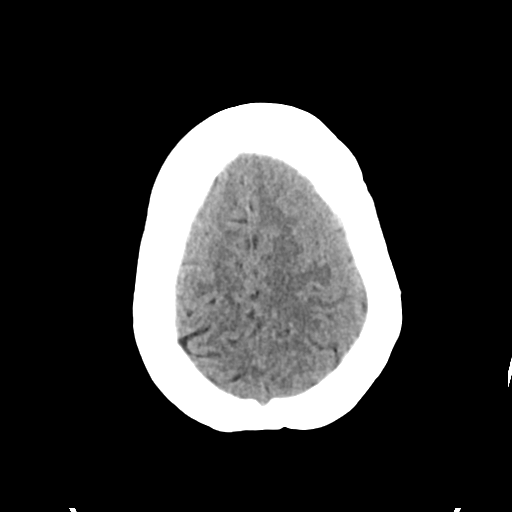
[im 32/37  brain]
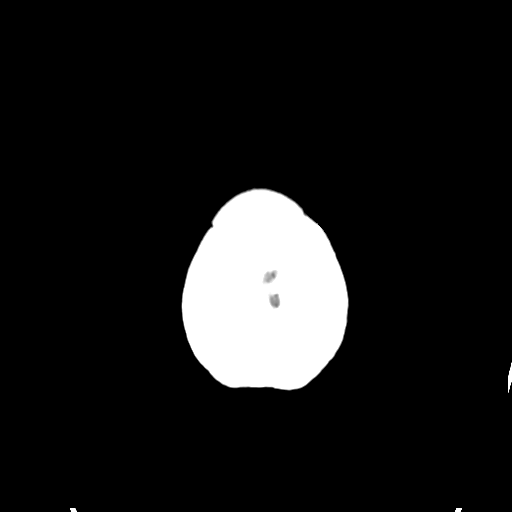

[Series 4: head bone · axial · 0.43mm/px · z∈[-170,-108]mm · 4 of 91 slices shown]
[im 10/91  bone]
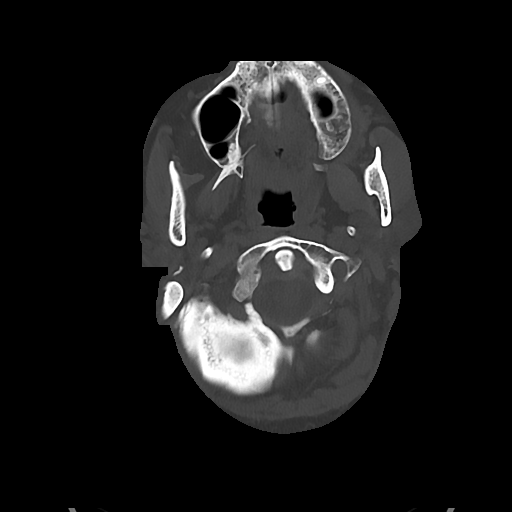
[im 19/91  bone]
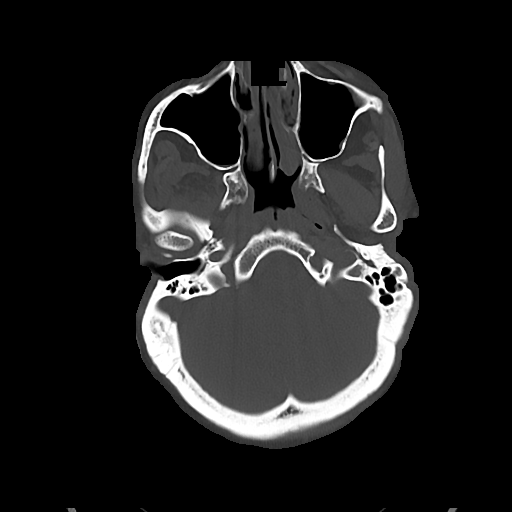
[im 28/91  bone]
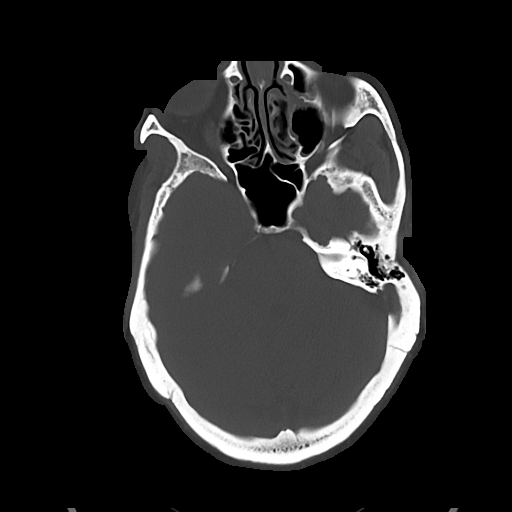
[im 41/91  bone]
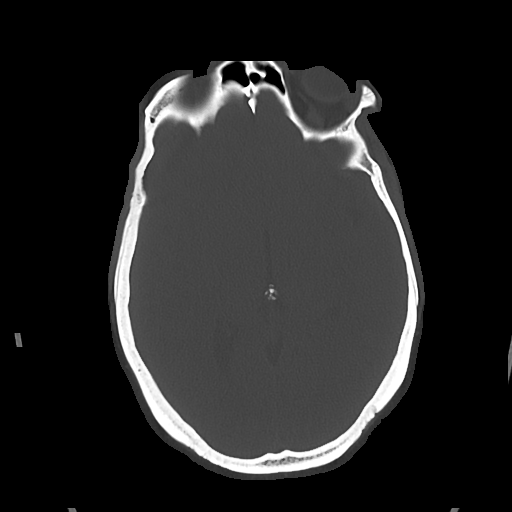

[Series 5: cor soft · coronal · 0.34mm/px · 3 of 73 slices shown]
[im 25/73  brain]
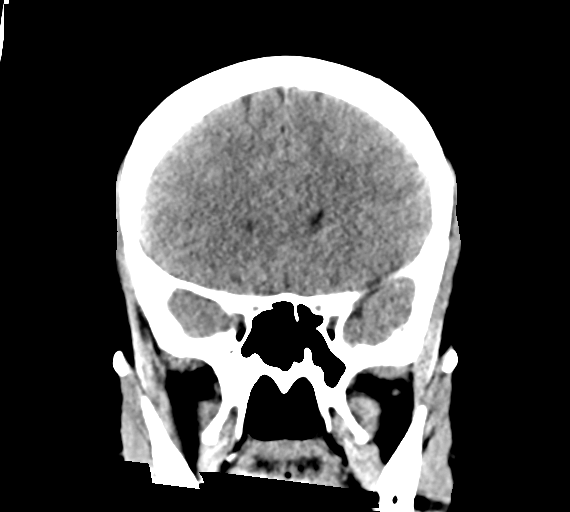
[im 33/73  brain]
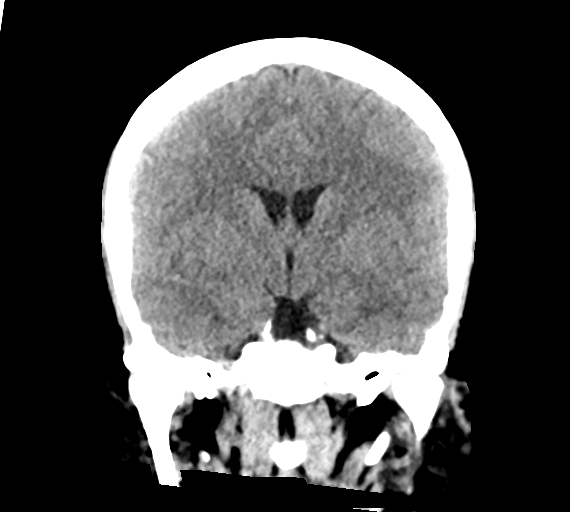
[im 41/73  brain]
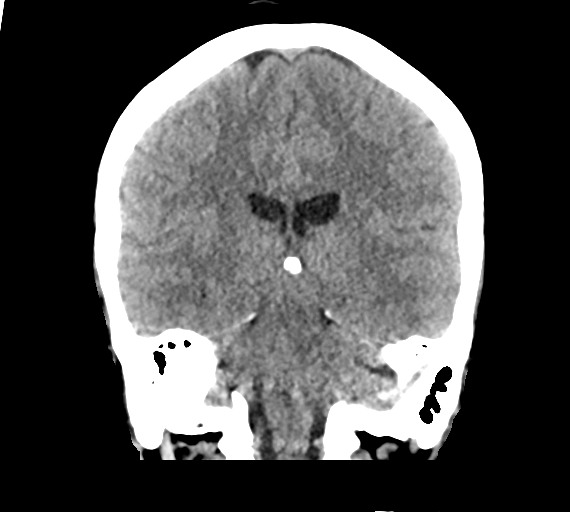

[Series 6: sag soft · sagittal · 0.36mm/px · 3 of 67 slices shown]
[im 27/67  brain]
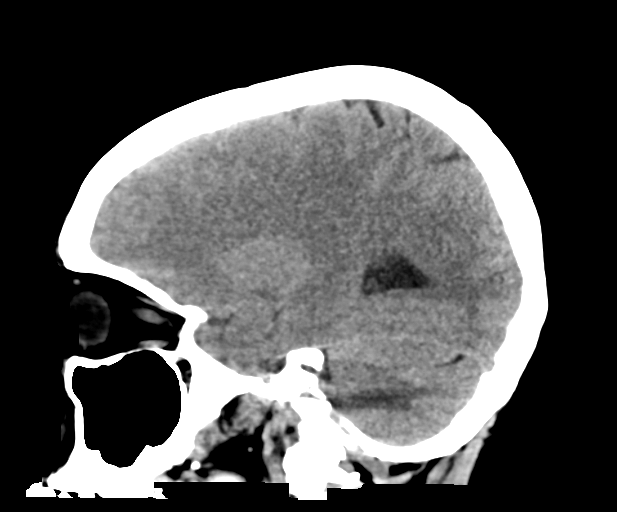
[im 34/67  brain]
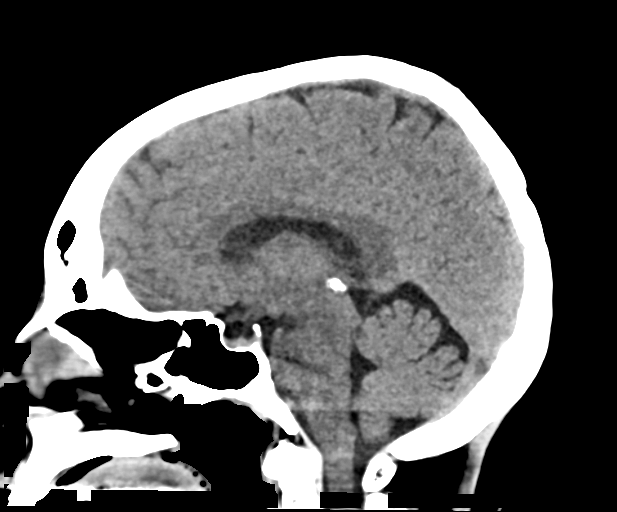
[im 41/67  brain]
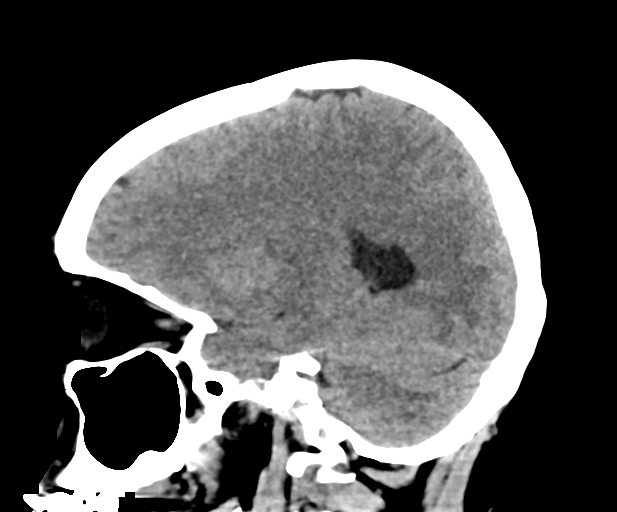

[17 of 47 positions shown; findings below may reference images not displayed]

FINDINGS: Brain: No evidence of acute infarction, hemorrhage, hydrocephalus,
extra-axial collection or mass lesion/mass effect.

Vascular: No hyperdense vessel or unexpected calcification.

Skull: Normal. Negative for fracture or focal lesion.

Sinuses/Orbits: No acute finding.

Other: None.
IMPRESSION: Normal head CT.

## 2020-08-05 ENCOUNTER — Encounter: Payer: Self-pay | Admitting: *Deleted

## 2020-08-05 NOTE — Congregational Nurse Program (Signed)
  Dept: [phone]   Congregational Nurse Program Note  Date of Encounter: 08/05/2020  Past Medical History: Past Medical History:  Diagnosis Date   Anemia    Anemia    Asthma    Bleeding hemorrhoid     Encounter Details:  CNP Questionnaire - 08/05/20 1320      Questionnaire   Do you give verbal consent to treat you today? Yes    Visit Setting Church or Organization    Location Patient Served At [facility]    Patient Status Immigrant    Medical Provider No    Insurance Private Insurance    Intervention Assess (including screenings);Educate;Support    Transportation Need transportation assistance    ED Visit Averted Yes            Client came into clinic for BP check and assistance with job finding.  Educated client about BP elevation.  Went over healthy eating and exercise guidelines to improve health.  Client has lost about 25 lbs during Ramadan but would like to lose more.  Client expresses very stressful life with 5 children and 2 jobs currently.  Client is also worried about her mother's health.  Provided listening support.  Client encouraged to follow up with me periodically.  Roderic Palau, RN, MSN, CNP [phone] Office [phone] Cell

## 2021-05-11 ENCOUNTER — Encounter: Payer: Self-pay | Admitting: Hematology and Oncology

## 2021-05-20 ENCOUNTER — Ambulatory Visit: Payer: Self-pay | Admitting: Podiatry

## 2021-06-08 ENCOUNTER — Encounter: Payer: Self-pay | Admitting: Hematology and Oncology

## 2021-06-10 ENCOUNTER — Ambulatory Visit: Payer: Self-pay | Admitting: Podiatry

## 2022-11-17 ENCOUNTER — Emergency Department (HOSPITAL_COMMUNITY)
Admission: EM | Admit: 2022-11-17 | Discharge: 2022-11-17 | Disposition: A | Discharge status: Home / Self Care | Payer: BLUE CROSS/BLUE SHIELD | Attending: Emergency Medicine | Admitting: Emergency Medicine

## 2022-11-17 ENCOUNTER — Encounter (HOSPITAL_COMMUNITY): Payer: Self-pay

## 2022-11-17 ENCOUNTER — Encounter: Payer: Self-pay | Admitting: Hematology and Oncology

## 2022-11-17 DIAGNOSIS — J45909 Unspecified asthma, uncomplicated: Secondary | ICD-10-CM | POA: Diagnosis not present

## 2022-11-17 DIAGNOSIS — R109 Unspecified abdominal pain: Secondary | ICD-10-CM | POA: Insufficient documentation

## 2022-11-17 DIAGNOSIS — R110 Nausea: Secondary | ICD-10-CM | POA: Diagnosis not present

## 2022-11-17 DIAGNOSIS — R630 Anorexia: Secondary | ICD-10-CM | POA: Diagnosis not present

## 2022-11-17 LAB — URINALYSIS, ROUTINE W REFLEX MICROSCOPIC
Bacteria, UA: NONE SEEN
Bilirubin Urine: NEGATIVE
Glucose, UA: NEGATIVE mg/dL
Hgb urine dipstick: NEGATIVE
Ketones, ur: NEGATIVE mg/dL
Nitrite: NEGATIVE
Protein, ur: NEGATIVE mg/dL
Specific Gravity, Urine: 1.01 (ref 1.005–1.030)
pH: 7 (ref 5.0–8.0)

## 2022-11-17 LAB — CBC
HCT: 33.6 % — ABNORMAL LOW (ref 36.0–46.0)
Hemoglobin: 9.9 g/dL — ABNORMAL LOW (ref 12.0–15.0)
MCH: 17.9 pg — ABNORMAL LOW (ref 26.0–34.0)
MCHC: 29.5 g/dL — ABNORMAL LOW (ref 30.0–36.0)
MCV: 60.8 fL — ABNORMAL LOW (ref 80.0–100.0)
Platelets: 386 10*3/uL (ref 150–400)
RBC: 5.53 MIL/uL — ABNORMAL HIGH (ref 3.87–5.11)
RDW: 16.3 % — ABNORMAL HIGH (ref 11.5–15.5)
WBC: 4.6 10*3/uL (ref 4.0–10.5)
nRBC: 0 % (ref 0.0–0.2)

## 2022-11-17 LAB — COMPREHENSIVE METABOLIC PANEL
ALT: 13 U/L (ref 0–44)
AST: 16 U/L (ref 15–41)
Albumin: 3.9 g/dL (ref 3.5–5.0)
Alkaline Phosphatase: 100 U/L (ref 38–126)
Anion gap: 7 (ref 5–15)
BUN: 11 mg/dL (ref 6–20)
CO2: 27 mmol/L (ref 22–32)
Calcium: 9.1 mg/dL (ref 8.9–10.3)
Chloride: 101 mmol/L (ref 98–111)
Creatinine, Ser: 0.58 mg/dL (ref 0.44–1.00)
GFR, Estimated: >60 mL/min (ref >60)
Glucose, Bld: 94 mg/dL (ref 70–99)
Potassium: 3.8 mmol/L (ref 3.5–5.1)
Sodium: 135 mmol/L (ref 135–145)
Total Bilirubin: 0.4 mg/dL (ref 0.3–1.2)
Total Protein: 8 g/dL (ref 6.5–8.1)

## 2022-11-17 LAB — LIPASE, BLOOD: Lipase: 28 U/L (ref 11–51)

## 2022-11-17 MED ORDER — OMEPRAZOLE 20 MG PO CPDR: 20.0000 mg | DELAYED_RELEASE_CAPSULE | Freq: Every day | ORAL | 0 refills | Status: DC

## 2022-11-17 MED ORDER — FAMOTIDINE 20 MG PO TABS: 20.0000 mg | ORAL_TABLET | Freq: Two times a day (BID) | ORAL | 0 refills | Status: DC

## 2022-11-17 MED ORDER — FAMOTIDINE 20 MG PO TABS
20.0000 mg | ORAL_TABLET | Freq: Once | ORAL | Status: AC
  Administered 2022-11-17: 20 mg via ORAL
  Filled 2022-11-17: qty 1

## 2022-11-17 NOTE — ED Triage Notes (Signed)
Pt arrived POV for 2 days of loss of appetite, feeling constantly "full", reports some nausea, denies v/d. Last bowel movement this am. Denies abd distention, denies urinary symptoms.  VSS, NAD noted,

## 2022-11-17 NOTE — ED Provider Notes (Signed)
[place] EMERGENCY DEPARTMENT AT [facility] Provider Note   CSN: [id number] Arrival date & time: 11/17/22  0272     History  Chief Complaint  Patient presents with   Abdominal Pain    Holly Newton is a 52 y.o. female.  Patient presents to the emergency department complaining of 2 days of decreased appetite and feeling full when swallowing.  She endorses mild nausea with eating fatty foods but denies vomiting or diarrhea.  She denies chest pain, shortness of breath, fevers, dysuria, vaginal discharge.  She she is currently asymptomatic.  She feels that fatty foods are her trigger.  Past medical history significant for asthma, iron deficiency anemia   Abdominal Pain      Home Medications Prior to Admission medications   Medication Sig Start Date End Date Taking? Authorizing Provider  famotidine (PEPCID) 20 MG tablet Take 1 tablet (20 mg total) by mouth 2 (two) times daily. 11/17/22  Yes Darrick Grinder, PA-C  omeprazole (PRILOSEC) 20 MG capsule Take 1 capsule (20 mg total) by mouth daily for 15 days. 11/17/22 12/02/22 Yes Darrick Grinder, PA-C      Allergies    Patient has no known allergies.    Review of Systems   Review of Systems  Gastrointestinal:  Positive for abdominal pain.    Physical Exam Updated Vital Signs BP 131/83 (BP Location: Left Arm)   Pulse 76   Temp 97.7 F (36.5 C) (Oral)   Resp 17   Ht 5\' 4"  (1.626 m)   Wt 90.7 kg   LMP  (Exact Date)   SpO2 100%   BMI 34.33 kg/m  Physical Exam Vitals and nursing note reviewed.  Constitutional:      General: She is not in acute distress.    Appearance: She is well-developed.  HENT:     Head: Normocephalic and atraumatic.  Eyes:     Conjunctiva/sclera: Conjunctivae normal.  Cardiovascular:     Rate and Rhythm: Normal rate and regular rhythm.  Pulmonary:     Effort: Pulmonary effort is normal. No respiratory distress.     Breath sounds: Normal breath sounds.  Abdominal:      Palpations: Abdomen is soft.     Tenderness: There is no abdominal tenderness.  Musculoskeletal:        General: No swelling.     Cervical back: Neck supple.  Skin:    General: Skin is warm and dry.     Capillary Refill: Capillary refill takes less than 2 seconds.  Neurological:     Mental Status: She is alert.  Psychiatric:        Mood and Affect: Mood normal.     ED Results / Procedures / Treatments   Labs (all labs ordered are listed, but only abnormal results are displayed) Labs Reviewed  CBC - Abnormal; Notable for the following components:      Result Value   RBC 5.53 (*)    Hemoglobin 9.9 (*)    HCT 33.6 (*)    MCV 60.8 (*)    MCH 17.9 (*)    MCHC 29.5 (*)    RDW 16.3 (*)    All other components within normal limits  URINALYSIS, ROUTINE W REFLEX MICROSCOPIC - Abnormal; Notable for the following components:   Color, Urine STRAW (*)    Leukocytes,Ua TRACE (*)    All other components within normal limits  LIPASE, BLOOD  COMPREHENSIVE METABOLIC PANEL    EKG None  Radiology No results  found.  Procedures Procedures    Medications Ordered in ED Medications  famotidine (PEPCID) tablet 20 mg (20 mg Oral Given 11/17/22 2254)    ED Course/ Medical Decision Making/ A&P                                 Medical Decision Making Amount and/or Complexity of Data Reviewed Labs: ordered.   This patient presents to the ED for concern of abdominal pain, this involves an extensive number of treatment options, and is a complaint that carries with it a high risk of complications and morbidity.  The differential diagnosis includes appendicitis, cholecystitis, GERD, colitis, diverticulitis, reflux, others   Co morbidities that complicate the patient evaluation  Asthma, iron deficiency anemia   Additional history obtained:   External records from outside source obtained and reviewed including previous lab results confirming patient's baseline hemoglobin   Lab  Tests:  I Ordered, and personally interpreted labs.  The pertinent results include:  Hemoglobin 9.9 at patient's baseline, lipase unremarkable, CMP unremarkable, unremarkable UA   Imaging Studies ordered:  Patient with no abdominal tenderness on exam.  No suspicion at this time of acute/surgical abdomen   Problem List / ED Course / Critical interventions / Medication management   I ordered medication including pepcid for reflux symptoms  Reevaluation of the patient after these medicines showed that the patient improved I have reviewed the patients home medicines and have made adjustments as needed   Test / Admission - Considered:  Patient with no acute symptoms at this time.  She states she is feeling well at the current moment.  No suspicion of surgical abdomen at this time with no abdominal tenderness.  Lab work appears to be at patient's baseline with no leukocytosis, normal lipase suggesting no pancreatitis.  Question of this may be GERD related.  Plan to start patient on short course of PPI and H2 blocker and have her follow-up with her primary care provider.  Return precautions provided.         Final Clinical Impression(s) / ED Diagnoses Final diagnoses:  Abdominal pain, unspecified abdominal location    Rx / DC Orders ED Discharge Orders          Ordered    famotidine (PEPCID) 20 MG tablet  2 times daily        11/17/22 2322    omeprazole (PRILOSEC) 20 MG capsule  Daily        11/17/22 2322              Pamala Duffel 11/17/22 2323    Tilden Fossa, MD 11/18/22 [phone]

## 2022-11-17 NOTE — Discharge Instructions (Addendum)
You were evaluated today for abdominal pain.  Your lab work was reassuring.  I have prescribed medication to help with reflux-like symptoms.  Please follow-up with your primary care provider.  If you develop intractable nausea and vomiting, intractable pain, or other life-threatening symptoms please return to the emergency department.

## 2023-04-22 ENCOUNTER — Emergency Department (HOSPITAL_COMMUNITY)
Admission: EM | Admit: 2023-04-22 | Discharge: 2023-04-22 | Disposition: A | Discharge status: Home / Self Care | Payer: No Typology Code available for payment source | Attending: Emergency Medicine | Admitting: Emergency Medicine

## 2023-04-22 ENCOUNTER — Other Ambulatory Visit: Payer: Self-pay

## 2023-04-22 ENCOUNTER — Encounter (HOSPITAL_COMMUNITY): Payer: Self-pay

## 2023-04-22 ENCOUNTER — Encounter: Payer: Self-pay | Admitting: Hematology and Oncology

## 2023-04-22 DIAGNOSIS — J45909 Unspecified asthma, uncomplicated: Secondary | ICD-10-CM | POA: Insufficient documentation

## 2023-04-22 DIAGNOSIS — J111 Influenza due to unidentified influenza virus with other respiratory manifestations: Secondary | ICD-10-CM

## 2023-04-22 DIAGNOSIS — Z79899 Other long term (current) drug therapy: Secondary | ICD-10-CM | POA: Insufficient documentation

## 2023-04-22 DIAGNOSIS — J101 Influenza due to other identified influenza virus with other respiratory manifestations: Secondary | ICD-10-CM | POA: Insufficient documentation

## 2023-04-22 DIAGNOSIS — R52 Pain, unspecified: Secondary | ICD-10-CM | POA: Diagnosis present

## 2023-04-22 LAB — RESP PANEL BY RT-PCR (RSV, FLU A&B, COVID)  RVPGX2
Influenza A by PCR: POSITIVE — AB
Influenza B by PCR: NEGATIVE
Resp Syncytial Virus by PCR: NEGATIVE
SARS Coronavirus 2 by RT PCR: NEGATIVE

## 2023-04-22 NOTE — ED Provider Notes (Signed)
 [place] EMERGENCY DEPARTMENT AT [facility] Provider Note   CSN: [id number] Arrival date & time: 04/22/23  1137     History  Chief Complaint  Patient presents with   Generalized Body Aches    Holly Newton is a 53 y.o. female who presents with cough, body aches, chills started yesterday.  Cough is nonproductive.  Denies any vomiting or diarrhea.  No abdominal pain.  She does have a history of asthma.  She does endorse some difficulty breathing.  HPI    Past Medical History:  Diagnosis Date   Anemia    Anemia    Asthma    Bleeding hemorrhoid      Home Medications Prior to Admission medications   Medication Sig Start Date End Date Taking? Authorizing Provider  famotidine (PEPCID) 20 MG tablet Take 1 tablet (20 mg total) by mouth 2 (two) times daily. 11/17/22   Darrick Grinder, PA-C  omeprazole (PRILOSEC) 20 MG capsule Take 1 capsule (20 mg total) by mouth daily for 15 days. 11/17/22 12/02/22  Darrick Grinder, PA-C      Allergies    Patient has no known allergies.    Review of Systems   Review of Systems  Respiratory:  Positive for cough.     Physical Exam Updated Vital Signs BP 138/79   Pulse 98   Temp 98.4 F (36.9 C) (Oral)   Resp 18   Ht 5\' 4"  (1.626 m)   Wt 90.7 kg   LMP 06/28/2017 (Exact Date)   SpO2 99%   BMI 34.32 kg/m  Physical Exam Vitals and nursing note reviewed.  Constitutional:      General: She is not in acute distress.    Appearance: She is well-developed.  HENT:     Head: Normocephalic and atraumatic.     Mouth/Throat:     Pharynx: No oropharyngeal exudate or posterior oropharyngeal erythema.  Eyes:     Conjunctiva/sclera: Conjunctivae normal.  Cardiovascular:     Rate and Rhythm: Normal rate and regular rhythm.     Heart sounds: No murmur heard. Pulmonary:     Effort: Pulmonary effort is normal. No respiratory distress.     Breath sounds: Normal breath sounds. No wheezing or rales.  Abdominal:     Palpations:  Abdomen is soft.     Tenderness: There is no abdominal tenderness. There is no guarding or rebound.  Musculoskeletal:        General: No swelling.     Cervical back: Neck supple.  Skin:    General: Skin is warm and dry.     Capillary Refill: Capillary refill takes less than 2 seconds.  Neurological:     Mental Status: She is alert.  Psychiatric:        Mood and Affect: Mood normal.     ED Results / Procedures / Treatments   Labs (all labs ordered are listed, but only abnormal results are displayed) Labs Reviewed  RESP PANEL BY RT-PCR (RSV, FLU A&B, COVID)  RVPGX2 - Abnormal; Notable for the following components:      Result Value   Influenza A by PCR POSITIVE (*)    All other components within normal limits    EKG None  Radiology No results found.  Procedures Procedures    Medications Ordered in ED Medications - No data to display  ED Course/ Medical Decision Making/ A&P  Medical Decision Making  This patient presents to the ED with chief complaint(s) of cough .  The complaint involves an extensive differential diagnosis and also carries with it a high risk of complications and morbidity.   pertinent past medical history as listed in HPI  The differential diagnosis includes  viral illness, pharyngitis, mono, sinusitis,AOM, pneumonia  The initial plan is to  Obtain respiratory panel Additional history obtained: Records reviewed Care Everywhere/External Records  Initial Assessment:   Nontoxic-appearing patient presenting with URI symptoms.  I have a low suspicion for pneumonia as lung sounds are clear and patient is afebrile.  No exudate or significant cervical lymphadenopathy on exam to suggest strep or mono.  Overall history and exam are most suspicious for viral URI.  Patient is hemodynamically stable and not requiring oxygen.  Anticipate discharge home with supportive care.  Independent ECG interpretation:  None   Independent  labs interpretation:  The following labs were independently interpreted:  Respiratory panel positive for influenza A  Independent visualization and interpretation of imaging: none  Treatment and Reassessment: No medications administered during visit   Consultations obtained:   None   Disposition:   Patient will be discharged home.  Educated on supportive care.  Encouraged to follow-up with primary care provider should her symptoms persist. The patient has been appropriately medically screened and/or stabilized in the ED. I have low suspicion for any other emergent medical condition which would require further screening, evaluation or treatment in the ED or require inpatient management. At time of discharge the patient is hemodynamically stable and in no acute distress. I have discussed work-up results and diagnosis with patient and answered all questions. Patient is agreeable with discharge plan. We discussed strict return precautions for returning to the emergency department and they verbalized understanding.     Social Determinants of Health:   Patient's impaired access to primary care  increases the complexity of managing their presentation  This note was dictated with voice recognition software.  Despite best efforts at proofreading, errors may have occurred which can change the documentation meaning.          Final Clinical Impression(s) / ED Diagnoses Final diagnoses:  Influenza    Rx / DC Orders ED Discharge Orders     None         Halford Decamp, PA-C 04/22/23 1303    Terald Sleeper, MD 04/22/23 1308

## 2023-04-22 NOTE — ED Triage Notes (Signed)
 Patient is here for evaluation of generalized body aches. Reports loss of appetite and general illness. States headache as well. Reports this all started yesterday night.

## 2023-04-22 NOTE — Discharge Instructions (Addendum)
 You were evaluated in the emergency room for cough and body aches.  You tested positive for influenza A.  This is a virus that is anticipated to resolve without antibiotics.  You may use Tylenol 1000 mg and/or Motrin 600 mg every 4-6 hours up to 3 times a day for fever or body aches.  Please keep in mind that this dosing is not meant to be continued long-term and that many over-the-counter cough and flu medications contain acetaminophen or ibuprofen. You can expect your current symptoms to linger over the next week or two but please return to the emergency room if you experience any new or worsening symptoms including persistent fevers, worsening productive cough and persistent vomiting. Please follow-up with your primary care provider regarding your ER visit.  It is your responsibility to obtain any results from your visit and review any findings with your primary care doctor.

## 2023-04-26 ENCOUNTER — Encounter (HOSPITAL_COMMUNITY): Payer: Self-pay

## 2023-04-26 ENCOUNTER — Emergency Department (HOSPITAL_COMMUNITY)
Admission: EM | Admit: 2023-04-26 | Discharge: 2023-04-26 | Disposition: A | Discharge status: Home / Self Care | Attending: Emergency Medicine | Admitting: Emergency Medicine

## 2023-04-26 ENCOUNTER — Emergency Department (HOSPITAL_COMMUNITY)

## 2023-04-26 ENCOUNTER — Other Ambulatory Visit: Payer: Self-pay

## 2023-04-26 DIAGNOSIS — J45909 Unspecified asthma, uncomplicated: Secondary | ICD-10-CM | POA: Diagnosis not present

## 2023-04-26 DIAGNOSIS — N3000 Acute cystitis without hematuria: Secondary | ICD-10-CM | POA: Diagnosis not present

## 2023-04-26 DIAGNOSIS — J101 Influenza due to other identified influenza virus with other respiratory manifestations: Secondary | ICD-10-CM | POA: Insufficient documentation

## 2023-04-26 DIAGNOSIS — R051 Acute cough: Secondary | ICD-10-CM

## 2023-04-26 DIAGNOSIS — R059 Cough, unspecified: Secondary | ICD-10-CM | POA: Diagnosis present

## 2023-04-26 LAB — URINALYSIS, ROUTINE W REFLEX MICROSCOPIC
Bilirubin Urine: NEGATIVE
Glucose, UA: NEGATIVE mg/dL
Ketones, ur: NEGATIVE mg/dL
Nitrite: NEGATIVE
Protein, ur: NEGATIVE mg/dL
Specific Gravity, Urine: 1.021 (ref 1.005–1.030)
pH: 5 (ref 5.0–8.0)

## 2023-04-26 LAB — BASIC METABOLIC PANEL WITH GFR
Anion gap: 9 (ref 5–15)
BUN: <5 mg/dL — ABNORMAL LOW (ref 6–20)
CO2: 25 mmol/L (ref 22–32)
Calcium: 8.5 mg/dL — ABNORMAL LOW (ref 8.9–10.3)
Chloride: 104 mmol/L (ref 98–111)
Creatinine, Ser: 0.63 mg/dL (ref 0.44–1.00)
GFR, Estimated: >60 mL/min
Glucose, Bld: 110 mg/dL — ABNORMAL HIGH (ref 70–99)
Potassium: 3.4 mmol/L — ABNORMAL LOW (ref 3.5–5.1)
Sodium: 138 mmol/L (ref 135–145)

## 2023-04-26 LAB — CBC WITH DIFFERENTIAL/PLATELET
Abs Immature Granulocytes: 0.01 10*3/uL (ref 0.00–0.07)
Basophils Absolute: 0 10*3/uL (ref 0.0–0.1)
Basophils Relative: 0 %
Eosinophils Absolute: 0 10*3/uL (ref 0.0–0.5)
Eosinophils Relative: 0 %
HCT: 33.7 % — ABNORMAL LOW (ref 36.0–46.0)
Hemoglobin: 10.1 g/dL — ABNORMAL LOW (ref 12.0–15.0)
Immature Granulocytes: 0 %
Lymphocytes Relative: 59 %
Lymphs Abs: 1.4 10*3/uL (ref 0.7–4.0)
MCH: 17.9 pg — ABNORMAL LOW (ref 26.0–34.0)
MCHC: 30 g/dL (ref 30.0–36.0)
MCV: 59.6 fL — ABNORMAL LOW (ref 80.0–100.0)
Monocytes Absolute: 0.1 10*3/uL (ref 0.1–1.0)
Monocytes Relative: 5 %
Neutro Abs: 0.9 10*3/uL — ABNORMAL LOW (ref 1.7–7.7)
Neutrophils Relative %: 36 %
Platelets: 257 10*3/uL (ref 150–400)
RBC: 5.65 MIL/uL — ABNORMAL HIGH (ref 3.87–5.11)
RDW: 16.6 % — ABNORMAL HIGH (ref 11.5–15.5)
WBC: 2.4 10*3/uL — ABNORMAL LOW (ref 4.0–10.5)
nRBC: 0 % (ref 0.0–0.2)

## 2023-04-26 MED ORDER — CEPHALEXIN 500 MG PO CAPS: 500.0000 mg | ORAL_CAPSULE | Freq: Two times a day (BID) | ORAL | 0 refills | Status: AC

## 2023-04-26 MED ORDER — BENZONATATE 100 MG PO CAPS
100.0000 mg | ORAL_CAPSULE | Freq: Once | ORAL | Status: AC
  Administered 2023-04-26: 100 mg via ORAL
  Filled 2023-04-26: qty 1

## 2023-04-26 MED ORDER — BENZONATATE 100 MG PO CAPS: 100.0000 mg | ORAL_CAPSULE | Freq: Three times a day (TID) | ORAL | 0 refills | Status: DC

## 2023-04-26 MED ORDER — CEPHALEXIN 250 MG PO CAPS
500.0000 mg | ORAL_CAPSULE | Freq: Once | ORAL | Status: AC
  Administered 2023-04-26: 500 mg via ORAL
  Filled 2023-04-26: qty 2

## 2023-04-26 NOTE — ED Provider Notes (Signed)
 [place] EMERGENCY DEPARTMENT AT [facility] Provider Note   CSN: [id number] Arrival date & time: 04/26/23  1414     History  Chief Complaint  Patient presents with   Cough    Holly Newton is a 53 y.o. female.  Patient with history of iron deficiency anemia, beta thalassemia minor -- presents to the emergency department with ongoing coughing, body aches after being diagnosed with the flu on 04/22/23.  Today is day 5 of illness.  No fevers reported.  No significant shortness of breath reported.  She does have a history of asthma and has been using her inhaler at home.  She has brought up some sputum which contained a small amount of blood (she shows me a picture of this).  She also reports some left-sided flank pain, below the rib area.  No dysuria but does have increased urination.  She has a mild headache without confusion or vomiting.  No reported history of blood clot.  No chest pain with deep breathing.       Home Medications Prior to Admission medications   Medication Sig Start Date End Date Taking? Authorizing Provider  famotidine (PEPCID) 20 MG tablet Take 1 tablet (20 mg total) by mouth 2 (two) times daily. 11/17/22   Darrick Grinder, PA-C  omeprazole (PRILOSEC) 20 MG capsule Take 1 capsule (20 mg total) by mouth daily for 15 days. 11/17/22 12/02/22  Darrick Grinder, PA-C      Allergies    Patient has no known allergies.    Review of Systems   Review of Systems  Physical Exam Updated Vital Signs BP 119/85 (BP Location: Right Arm)   Pulse (!) 104   Temp 98.5 F (36.9 C)   Resp (!) 22   LMP 06/28/2017 (Exact Date)   SpO2 100%   Physical Exam Vitals and nursing note reviewed.  Constitutional:      General: She is not in acute distress.    Appearance: She is well-developed.  HENT:     Head: Normocephalic and atraumatic.     Right Ear: External ear normal.     Left Ear: External ear normal.     Nose: Nose normal.     Mouth/Throat:     Mouth:  Mucous membranes are moist.  Eyes:     Conjunctiva/sclera: Conjunctivae normal.  Cardiovascular:     Rate and Rhythm: Normal rate and regular rhythm.     Heart sounds: No murmur heard. Pulmonary:     Effort: No respiratory distress.     Breath sounds: No wheezing, rhonchi or rales.  Abdominal:     Palpations: Abdomen is soft.     Tenderness: There is no abdominal tenderness. There is no guarding or rebound.     Comments: Tenderness over the left flank, inferior to the ribs.  Musculoskeletal:     Cervical back: Normal range of motion and neck supple.     Right lower leg: No edema.     Left lower leg: No edema.  Skin:    General: Skin is warm and dry.     Findings: No rash.  Neurological:     General: No focal deficit present.     Mental Status: She is alert. Mental status is at baseline.     Motor: No weakness.  Psychiatric:        Mood and Affect: Mood normal.     ED Results / Procedures / Treatments   Labs (all labs ordered are listed,  but only abnormal results are displayed) Labs Reviewed  CBC WITH DIFFERENTIAL/PLATELET - Abnormal; Notable for the following components:      Result Value   WBC 2.4 (*)    RBC 5.65 (*)    Hemoglobin 10.1 (*)    HCT 33.7 (*)    MCV 59.6 (*)    MCH 17.9 (*)    RDW 16.6 (*)    Neutro Abs 0.9 (*)    All other components within normal limits  BASIC METABOLIC PANEL - Abnormal; Notable for the following components:   Potassium 3.4 (*)    Glucose, Bld 110 (*)    BUN <5 (*)    Calcium 8.5 (*)    All other components within normal limits  URINALYSIS, ROUTINE W REFLEX MICROSCOPIC - Abnormal; Notable for the following components:   APPearance HAZY (*)    Hgb urine dipstick SMALL (*)    Leukocytes,Ua MODERATE (*)    Bacteria, UA RARE (*)    All other components within normal limits    EKG None  Radiology DG Chest 2 View Result Date: 04/26/2023 CLINICAL DATA:  Cough for 5 days.  Shortness of breath.  Body aches. EXAM: CHEST - 2 VIEW  COMPARISON:  02/15/2018 FINDINGS: The heart size and mediastinal contours are within normal limits. Both lungs are clear. The visualized skeletal structures are unremarkable. IMPRESSION: No active cardiopulmonary disease. Electronically Signed   By: Danae Orleans M.D.   On: 04/26/2023 17:04    Procedures Procedures    Medications Ordered in ED Medications  cephALEXin (KEFLEX) capsule 500 mg (has no administration in time range)  benzonatate (TESSALON) capsule 100 mg (100 mg Oral Given 04/26/23 1930)    ED Course/ Medical Decision Making/ A&P    Patient seen and examined. History obtained directly from patient. Work-up including labs, imaging, EKG ordered in triage, if performed, were reviewed.    Labs/EKG: Independently reviewed and interpreted.  This included: CBC with neutropenia, worse than her baseline neutropenia; hemoglobin stable, microcytic; BMP unremarkable.  Added UA.  Imaging: Independently visualized and interpreted.  This included: Chest x-ray, agree negative.  Medications/Fluids: Ordered: Tessalon   Most recent vital signs reviewed and are as follows: BP 119/85 (BP Location: Right Arm)   Pulse (!) 104   Temp 98.5 F (36.9 C)   Resp (!) 22   LMP 06/28/2017 (Exact Date)   SpO2 100%   Initial impression: Persistent cough after flu diagnosis, minimal hemoptysis noted likely due to the flu and infection, no chest pain, hypoxia, or severe symptoms to really suggest PE at this time.  She does have some urinary symptoms and pending UA.  8:00 PM Reassessment performed. Patient appears stable.  Family member at bedside.  Labs personally reviewed and interpreted including: UA suggestive of UTI, clean-catch, 21-50 white blood cells.  Reviewed pertinent lab work and imaging with patient at bedside. Questions answered.   Most current vital signs reviewed and are as follows: BP 136/82 (BP Location: Left Arm)   Pulse 89   Temp 99.1 F (37.3 C) (Oral)   Resp 19   LMP  06/28/2017 (Exact Date)   SpO2 100%   Plan: Discharge to home.  Will give dose of Keflex here.    Prescriptions written for: Keflex and Tessalon for home.  Other home care instructions discussed: Rest, hydration  ED return instructions discussed: Worsening shortness of breath, fever, vomiting, worsening flank or abdominal pain  Follow-up instructions discussed: Patient encouraged to follow-up with their PCP in 5  days.                                 Medical Decision Making Amount and/or Complexity of Data Reviewed Labs: ordered.  Risk Prescription drug management.   Patient's main complaint today was ongoing cough in setting of recent diagnosis of flu.  She did have some scant hemoptysis.  No severe chest pain, hypoxia, shortness of breath to suggest PE.  I think that the hemoptysis is likely related to her ongoing pulmonary infection.  No pneumonia on chest x-ray today.  She was also having some mild flank pain.  UA suggestive of UTI.  Given current clinical picture, I have low concern for pyelonephritis.  Patient is well appearing, tolerating orals.  Will give Keflex.  The patient's vital signs, pertinent lab work and imaging were reviewed and interpreted as discussed in the ED course. Hospitalization was considered for further testing, treatments, or serial exams/observation. However as patient is well-appearing, has a stable exam, and reassuring studies today, I do not feel that they warrant admission at this time. This plan was discussed with the patient who verbalizes agreement and comfort with this plan and seems reliable and able to return to the Emergency Department with worsening or changing symptoms.         Final Clinical Impression(s) / ED Diagnoses Final diagnoses:  Acute cystitis without hematuria  Acute cough  Influenza A    Rx / DC Orders ED Discharge Orders          Ordered    cephALEXin (KEFLEX) 500 MG capsule  2 times daily        04/26/23 1958     benzonatate (TESSALON) 100 MG capsule  Every 8 hours        04/26/23 1958              Renne Crigler, PA-C 04/26/23 2003    HortonClabe Seal, DO 04/26/23 2328

## 2023-04-26 NOTE — ED Triage Notes (Addendum)
 Pt c/o cough x 5 days. Pt has had body aches, frequent urination and headache. Pt diagnosed with flu A on 2/28.

## 2023-04-26 NOTE — ED Provider Triage Note (Signed)
 Emergency Medicine Provider Triage Evaluation Note  Holly Newton , a 53 y.o. female  was evaluated in triage.  Pt complains of coughing, shortness of breath, tested positive for the flu.  Patient states "I need an antibiotic."  She has a history of asthma and has been using her inhaler more frequently..  Review of Systems  Positive: Cough Negative: Fever  Physical Exam  BP 119/85 (BP Location: Right Arm)   Pulse (!) 104   Temp 98.5 F (36.9 C)   Resp (!) 22   LMP 06/28/2017 (Exact Date)   SpO2 100%  Gen:   Awake, no distress   Resp:  Normal effort  MSK:   Moves extremities without difficulty  Other:  No wheezing  Medical Decision Making  Medically screening exam initiated at 2:26 PM.  Appropriate orders placed.  [name] was informed that the remainder of the evaluation will be completed by another provider, this initial triage assessment does not replace that evaluation, and the importance of remaining in the ED until their evaluation is complete.     Arthor Captain, PA-C 04/26/23 1427

## 2023-04-26 NOTE — Discharge Instructions (Addendum)
 Please read and follow all provided instructions.  Your diagnoses today include:  1. Influenza A   2. Acute cystitis without hematuria   3. Acute cough     Tests performed today include: Complete blood cell count: Stable anemia, white blood cell count lower than typical, please make sure your primary care doctor is monitoring this Basic metabolic panel: No problems Urinalysis (urine test): Suggests urine test Chest x-ray: Does not show pneumonia Vital signs. See below for your results today.   Medications prescribed:  Keflex (cephalexin) - antibiotic  You have been prescribed an antibiotic medicine: take the entire course of medicine even if you are feeling better. Stopping early can cause the antibiotic not to work.  Tessalon Perles - cough suppressant medication  Take any prescribed medications only as directed.  Home care instructions:  Follow any educational materials contained in this packet.  BE VERY CAREFUL not to take multiple medicines containing Tylenol (also called acetaminophen). Doing so can lead to an overdose which can damage your liver and cause liver failure and possibly death.   Follow-up instructions: Please follow-up with your primary care provider in the next 5 days for further evaluation of your symptoms.   Return instructions:  Please return to the Emergency Department if you experience worsening symptoms.  Return with high fever, persistent vomiting, worsening abdominal pain or flank pain, shortness of breath is worsening Please return if you have any other emergent concerns.  Additional Information:  Your vital signs today were: BP 136/82 (BP Location: Left Arm)   Pulse 89   Temp 99.1 F (37.3 C) (Oral)   Resp 19   LMP 06/28/2017 (Exact Date)   SpO2 100%  If your blood pressure (BP) was elevated above 135/85 this visit, please have this repeated by your doctor within one month. --------------

## 2023-07-11 ENCOUNTER — Encounter (HOSPITAL_COMMUNITY): Payer: Self-pay | Admitting: *Deleted

## 2023-07-11 ENCOUNTER — Encounter: Payer: Self-pay | Admitting: Hematology and Oncology

## 2023-07-11 ENCOUNTER — Ambulatory Visit (HOSPITAL_COMMUNITY)
Admission: EM | Admit: 2023-07-11 | Discharge: 2023-07-11 | Disposition: A | Discharge status: Home / Self Care | Payer: Worker's Compensation | Attending: Emergency Medicine | Admitting: Emergency Medicine

## 2023-07-11 ENCOUNTER — Ambulatory Visit (INDEPENDENT_AMBULATORY_CARE_PROVIDER_SITE_OTHER): Payer: Worker's Compensation

## 2023-07-11 DIAGNOSIS — M25532 Pain in left wrist: Secondary | ICD-10-CM | POA: Diagnosis not present

## 2023-07-11 DIAGNOSIS — M25522 Pain in left elbow: Secondary | ICD-10-CM

## 2023-07-11 MED ORDER — NAPROXEN 375 MG PO TABS: 375.0000 mg | ORAL_TABLET | Freq: Two times a day (BID) | ORAL | 0 refills | Status: DC

## 2023-07-11 NOTE — ED Triage Notes (Signed)
 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 Denies parasthesias. Has applied Ace wrap and taken Tyl.

## 2023-07-11 NOTE — ED Provider Notes (Signed)
 [facility]    CSN: [id number] Arrival date & time: 07/11/23  1810      History   Chief Complaint Chief Complaint  Patient presents with   Arm Pain    HPI Holly Newton is a 53 y.o. female.   Patient presents with left wrist and elbow pain x 3 days.  Patient states that while she was at work 3 days ago she was carrying a heavy load of dishes on each arm and states that she has had pain since then.  Patient denies any falls or any direct trauma to her wrist or elbow.  Denies numbness, tingling, and numbness.  Patient states that she has been taking Tylenol  and applying an Ace wrap with some relief.  The history is provided by the patient and medical records.  Arm Pain    Past Medical History:  Diagnosis Date   Anemia    Anemia    Asthma    Bleeding hemorrhoid     Patient Active Problem List   Diagnosis Date Noted   Iron  deficiency anemia 03/08/2019   Dehydration 02/17/2018   Microcytic anemia 02/17/2018   Unresponsive episode 02/16/2018   History of anemia 04/26/2017   Dizziness 04/26/2017   Hemorrhoids 08/14/2010    Past Surgical History:  Procedure Laterality Date   EYE SURGERY  1977    OB History     Gravida  5   Para  5   Term  5   Preterm  0   AB  0   Living  5      SAB  0   IAB  0   Ectopic  0   Multiple  0   Live Births  5            Home Medications    Prior to Admission medications   Medication Sig Start Date End Date Taking? Authorizing Provider  famotidine  (PEPCID ) 20 MG tablet Take 1 tablet (20 mg total) by mouth 2 (two) times daily. 11/17/22  Yes Elisa Guest, PA-C  naproxen  (NAPROSYN ) 375 MG tablet Take 1 tablet (375 mg total) by mouth 2 (two) times daily. 07/11/23  Yes Rosevelt Constable, Jayme Cham A, NP  omeprazole  (PRILOSEC) 20 MG capsule Take 1 capsule (20 mg total) by mouth daily for 15 days. 11/17/22 07/11/23 Yes Elisa Guest, PA-C    Family History Family History  Problem Relation Age of Onset    Heart attack Father    Anemia Daughter     Social History Social History   Tobacco Use   Smoking status: Never   Smokeless tobacco: Never  Vaping Use   Vaping status: Never Used  Substance Use Topics   Alcohol use: No   Drug use: No     Allergies   Patient has no known allergies.   Review of Systems Review of Systems  Per HPI  Physical Exam Triage Vital Signs ED Triage Vitals  Encounter Vitals Group     BP 07/11/23 1948 127/80     Systolic BP Percentile --      Diastolic BP Percentile --      Pulse Rate 07/11/23 1948 80     Resp 07/11/23 1948 16     Temp 07/11/23 1948 97.6 F (36.4 C)     Temp Source 07/11/23 1948 Oral     SpO2 07/11/23 1948 100 %     Weight --      Height --      Head Circumference --  Peak Flow --      Pain Score 07/11/23 1949 8     Pain Loc --      Pain Education --      Exclude from Growth Chart --    No data found.  Updated Vital Signs BP 127/80   Pulse 80   Temp 97.6 F (36.4 C) (Oral)   Resp 16   LMP 06/28/2017 (Exact Date)   SpO2 100%   Visual Acuity Right Eye Distance:   Left Eye Distance:   Bilateral Distance:    Right Eye Near:   Left Eye Near:    Bilateral Near:     Physical Exam Vitals and nursing note reviewed.  Constitutional:      General: She is awake. She is not in acute distress.    Appearance: Normal appearance. She is well-developed and well-groomed. She is not ill-appearing.  Musculoskeletal:     Left elbow: Normal range of motion. Tenderness present in radial head and olecranon process.     Left forearm: Normal.     Left wrist: Tenderness and bony tenderness present. No swelling or deformity. Normal range of motion.  Skin:    General: Skin is warm and dry.  Neurological:     Mental Status: She is alert.  Psychiatric:        Behavior: Behavior is cooperative.      UC Treatments / Results  Labs (all labs ordered are listed, but only abnormal results are displayed) Labs Reviewed - No  data to display  EKG   Radiology DG Wrist Complete Left Result Date: 07/11/2023 CLINICAL DATA:  Wrist and elbow pain. EXAM: LEFT WRIST - COMPLETE 3+ VIEW COMPARISON:  None Available. FINDINGS: There is no evidence of fracture or dislocation. Incidental bone island in the scaphoid. There is no evidence of arthropathy or other focal bone abnormality. Soft tissues are unremarkable. IMPRESSION: Negative radiographs of the left wrist. Electronically Signed   By: Chadwick Colonel M.D.   On: 07/11/2023 20:44   DG Elbow Complete Left Result Date: 07/11/2023 CLINICAL DATA:  Wrist and elbow pain. EXAM: LEFT ELBOW - COMPLETE 3+ VIEW COMPARISON:  None Available. FINDINGS: There is no evidence of fracture, dislocation, or joint effusion. Alignment and joint spaces are normal. Mild degenerative coronoid spurring. Soft tissues are unremarkable. IMPRESSION: Mild degenerative coronoid spurring. Electronically Signed   By: Chadwick Colonel M.D.   On: 07/11/2023 20:43    Procedures Procedures (including critical care time)  Medications Ordered in UC Medications - No data to display  Initial Impression / Assessment and Plan / UC Course  I have reviewed the triage vital signs and the nursing notes.  Pertinent labs & imaging results that were available during my care of the patient were reviewed by me and considered in my medical decision making (see chart for details).     Patient is well-appearing.  Vitals are stable.  Patient endorses tenderness to palpation along radial head and olecranon process of left elbow.  Patient also endorses generalized tenderness to left wrist.  Without swelling or decreased range of motion.  X-rays ordered.  Based on my interpretation there is no acute osseous abnormality noted.  Radiology report confirms this.  Ordered wrist brace.  Recommended patient continue to wear Ace wrap to elbow as needed for comfort.  Prescribe naproxen  as needed for pain.  Given orthopedic  follow-up.  Discussed follow-up and return precautions. Final Clinical Impressions(s) / UC Diagnoses   Final diagnoses:  Left elbow pain  Left wrist pain     Discharge Instructions      Your x-rays were negative for any fracture. Wear wrist brace and Ace wrap as needed for comfort. You can take naproxen  twice daily as needed for pain.  Do not take this with other NSAIDs including ibuprofen , Motrin , Advil , and Aleve . You can take 650 mg of Tylenol  every 6-8 hours as needed for pain. Apply ice and heat as needed for pain. Rest and elevate your arm as often as possible. Follow-up with [facility] if your pain continues for further evaluation and management. Follow-up with primary care doctor or return here as needed.   ED Prescriptions     Medication Sig Dispense Auth. Provider   naproxen  (NAPROSYN ) 375 MG tablet Take 1 tablet (375 mg total) by mouth 2 (two) times daily. 20 tablet Levora Reas A, NP      PDMP not reviewed this encounter.   Levora Reas A, NP 07/11/23 2046

## 2023-07-11 NOTE — Discharge Instructions (Addendum)
 Your x-rays were negative for any fracture. Wear wrist brace and Ace wrap as needed for comfort. You can take naproxen  twice daily as needed for pain.  Do not take this with other NSAIDs including ibuprofen , Motrin , Advil , and Aleve . You can take 650 mg of Tylenol  every 6-8 hours as needed for pain. Apply ice and heat as needed for pain. Rest and elevate your arm as often as possible. Follow-up with [facility] if your pain continues for further evaluation and management. Follow-up with primary care doctor or return here as needed.

## 2023-07-14 ENCOUNTER — Encounter: Payer: Self-pay | Admitting: Hematology and Oncology

## 2023-09-19 ENCOUNTER — Encounter: Payer: Self-pay | Admitting: Hematology and Oncology

## 2023-09-19 ENCOUNTER — Encounter (HOSPITAL_COMMUNITY): Payer: Self-pay

## 2023-09-19 ENCOUNTER — Ambulatory Visit (HOSPITAL_COMMUNITY): Admission: EM | Admit: 2023-09-19 | Discharge: 2023-09-19 | Disposition: A | Discharge status: Home / Self Care

## 2023-09-19 DIAGNOSIS — S20211D Contusion of right front wall of thorax, subsequent encounter: Secondary | ICD-10-CM

## 2023-09-19 MED ORDER — BACLOFEN 10 MG PO TABS
10.0000 mg | ORAL_TABLET | Freq: Three times a day (TID) | ORAL | 0 refills | Status: AC
Start: 2023-09-19 — End: ?

## 2023-09-19 NOTE — ED Triage Notes (Signed)
 Pt states tripped and fell forward 10 days and having rt upper rib pain. States seen and tx'd at [facility] with med and had negative xrays. States still having pain.

## 2023-09-19 NOTE — Discharge Instructions (Addendum)
  1. Rib contusion, right, subsequent encounter (Primary) - Continue taking diclofenac (VOLTAREN) 75 MG EC tablet; Take 75 mg by mouth. - AMB referral to orthopedics for further evaluation and management of right rib contusion.  If advanced imaging is necessary they will determine and order testing. - baclofen  (LIORESAL ) 10 MG tablet; Take 1 tablet (10 mg total) by mouth 3 (three) times daily.  Dispense: 30 each; Refill: 0 -Continue to monitor symptoms for any change in severity if there is any escalation of current symptoms or development of new symptoms follow-up in ER for further evaluation and management.

## 2023-09-19 NOTE — ED Provider Notes (Signed)
 [facility]  Note:  This document was prepared using Dragon voice recognition software and may include unintentional dictation errors.  MRN: [id number] DOB: [birth year]  Subjective:   Holly Newton is a 53 y.o. female presenting for a fall that occurred about 10 days ago landing on her right ribs.  Patient was evaluated in UC and x-rays were negative.  Patient was given medication to help with symptoms but states that symptoms are not improving.  Patient would like to see a specialist for evaluation and possibly have an MRI of her right ribs.  Patient denies any significant difficulty with breathing or anterior chest pain.  Patient does not know what else to do to help with the pain.  Denies any previous trauma or injury to the chest wall prior to her fall 10 days ago.  No current facility-administered medications for this encounter.  Current Outpatient Medications:    baclofen  (LIORESAL ) 10 MG tablet, Take 1 tablet (10 mg total) by mouth 3 (three) times daily., Disp: 30 each, Rfl: 0   diclofenac (VOLTAREN) 75 MG EC tablet, Take 75 mg by mouth., Disp: , Rfl:    No Known Allergies  Past Medical History:  Diagnosis Date   Anemia    Anemia    Asthma    Bleeding hemorrhoid      Past Surgical History:  Procedure Laterality Date   EYE SURGERY  1977    Family History  Problem Relation Age of Onset   Heart attack Father    Anemia Daughter     Social History   Tobacco Use   Smoking status: Never   Smokeless tobacco: Never  Vaping Use   Vaping status: Never Used  Substance Use Topics   Alcohol use: No   Drug use: No    ROS Refer to HPI for ROS details.  Objective:   Vitals: BP (!) 142/83 (BP Location: Right Arm)   Pulse 72   Temp 98.1 F (36.7 C) (Oral)   Resp 18   LMP 06/28/2017 (Exact Date)   SpO2 100%   Physical Exam Vitals and nursing note reviewed.  Constitutional:      General: She is not in acute distress.    Appearance: She is  well-developed. She is not ill-appearing or toxic-appearing.  HENT:     Head: Normocephalic and atraumatic.     Mouth/Throat:     Mouth: Mucous membranes are moist.     Pharynx: Oropharynx is clear.  Cardiovascular:     Rate and Rhythm: Normal rate.  Pulmonary:     Effort: Pulmonary effort is normal. No respiratory distress.     Breath sounds: No stridor. No wheezing.  Chest:     Chest wall: Tenderness present.  Skin:    General: Skin is warm and dry.  Neurological:     General: No focal deficit present.     Mental Status: She is alert and oriented to person, place, and time.  Psychiatric:        Mood and Affect: Mood normal.        Behavior: Behavior normal.     Procedures  No results found for this or any previous visit (from the past 24 hours).  No results found.   Assessment and Plan :     Discharge Instructions       1. Rib contusion, right, subsequent encounter (Primary) - Continue taking diclofenac (VOLTAREN) 75 MG EC tablet; Take 75 mg by mouth. - AMB referral to orthopedics for further  evaluation and management of right rib contusion.  If advanced imaging is necessary they will determine and order testing. - baclofen  (LIORESAL ) 10 MG tablet; Take 1 tablet (10 mg total) by mouth 3 (three) times daily.  Dispense: 30 each; Refill: 0 -Continue to monitor symptoms for any change in severity if there is any escalation of current symptoms or development of new symptoms follow-up in ER for further evaluation and management.      Noha Karasik B Kathrynn Backstrom   [place], [place], [place] 09/19/23 2117

## 2023-09-29 ENCOUNTER — Ambulatory Visit: Admitting: Family Medicine

## 2023-09-29 ENCOUNTER — Other Ambulatory Visit: Payer: Self-pay

## 2023-09-29 ENCOUNTER — Encounter: Payer: Self-pay | Admitting: Family Medicine

## 2023-09-29 ENCOUNTER — Encounter: Payer: Self-pay | Admitting: Hematology and Oncology

## 2023-09-29 VITALS — BP 121/78 | Ht 64.0 in | Wt 206.0 lb

## 2023-09-29 DIAGNOSIS — R0781 Pleurodynia: Secondary | ICD-10-CM | POA: Diagnosis present

## 2023-09-29 DIAGNOSIS — S2231XA Fracture of one rib, right side, initial encounter for closed fracture: Secondary | ICD-10-CM | POA: Diagnosis not present

## 2023-09-29 NOTE — Progress Notes (Signed)
   Established Patient Office Visit  Subjective   Patient ID: Holly Newton, female    DOB: [birth year]  Age: 53 y.o. MRN: [id number]  CC: Rt rib pain  HPI Patient had a fall on 09/07/2023 and fell on the anterolateral aspect of her chest.  She has been having pain around the sixth rib since then.  Initially she was having pain with deep breathing, however her pain has gotten more tolerable as time goes on.  Currently when its at its worst is about a 5 out of 10.  No difficulty breathing.  Was seen at urgent care 08/23/2023, x-rays at that time were negative. - Notes from that visit were reviewed in detail during the visit today Had follow-up at a different urgent care 09/19/2023 due to ongoing pain - Notes reviewed during the visit today, no further imaging - Was recommended to use Voltaren 75 mg p.o. 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  Also responsible for cleaning up after meals.    Objective:     BP 121/78   Ht 5' 4 (1.626 m)   Wt 206 lb (93.4 kg)   LMP 06/28/2017 (Exact Date)   BMI 35.36 kg/m     Physical Exam General: NAD, well-appearing Respiratory: Normal respiratory effort, no distress Skin: Rib: No visible deformities, erythema. TTP at anterolateral 6th rib.  No palpable deformity or step-offs.  Symmetric rib motion and chest rise FROM with trunk flexion, extension, lateral bending, rotation Neurovascularly intact distally  Imaging: Limited MSK ultrasound of the right ribs Date: 09/29/2023 Indication: Right anterior lateral rib pain status post fall 09/07/2023 Findings: - US  of the ribs at point of maximal tenderness along the 6th rib reveals some soft tissue swelling and small area of cortical defect.  No other rib abnormalities noted - normal pleural movement noted under the ribs  Impression: Small,  non-displaced fracture of the 6th rib Images and interpretation completed by Rainell Cedar, DO      Assessment & Plan:   Problem List Items Addressed This Visit     Closed fracture of one rib of right side   Discussed with patient that healing of a broken rib can take up to 8-12 weeks. 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  Urged her to continue deep breathing, use lidocaine patches over-the-counter as needed.  Will write out of work until 8/18 to allow time for inflammation to go down.  Can continue using diclofenac that was prescribed at urgent care      Other Visit Diagnoses       Rib pain on right side    -  Primary   Relevant Orders   US  LIMITED JOINT SPACE STRUCTURES UP RIGHT       No follow-ups on file.    Rainell JAYSON Cedar, DO

## 2023-09-29 NOTE — Patient Instructions (Addendum)
 Based on your ultrasound, it looks like you have a small fracture (broken rib) on the right side from your fall - you can apply Salonpas Lidocaine patches to the area as needed.  You can buy these over the counter - you can continue the Diclofenac and Baclofen  as prescribed by urgent care - we will give you a note to keep you out of work until Monday 10/10/23 - you should follow-up with us  in 2-3 weeks if you are still having pain

## 2023-09-29 NOTE — Assessment & Plan Note (Addendum)
 Nondisplaced fracture of the right first rib status post fall 09/07/2023, is clinically improving, but still having pain  PLAN: - Discussed with patient that healing of a broken rib can take up to 8-12 weeks.  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  Will write out of work until 8/18 to allow time for inflammation to go down. - Urged her to continue deep breathing, use lidocaine patches over-the-counter as needed.     - Can continue using diclofenac and baclofen  that was prescribed at urgent care - follow-up 2-3 weeks for re-evaluation, sooner prn

## 2023-11-03 ENCOUNTER — Ambulatory Visit: Admitting: Family Medicine
